# Patient Record
Sex: Female | Born: 1935 | Race: White | Hispanic: No
Health system: Southern US, Community
[De-identification: ages and names within clinical notes are randomized; demographics above are authoritative.]

## PROBLEM LIST (undated history)

## (undated) DIAGNOSIS — Z980 Intestinal bypass and anastomosis status: Secondary | ICD-10-CM

## (undated) DIAGNOSIS — S065X9A Traumatic subdural hemorrhage with loss of consciousness of unspecified duration, initial encounter: Secondary | ICD-10-CM

## (undated) DIAGNOSIS — Z853 Personal history of malignant neoplasm of breast: Secondary | ICD-10-CM

## (undated) DIAGNOSIS — I10 Essential (primary) hypertension: Secondary | ICD-10-CM

## (undated) DIAGNOSIS — K279 Peptic ulcer, site unspecified, unspecified as acute or chronic, without hemorrhage or perforation: Secondary | ICD-10-CM

## (undated) DIAGNOSIS — S065XAA Traumatic subdural hemorrhage with loss of consciousness status unknown, initial encounter: Secondary | ICD-10-CM

## (undated) HISTORY — DX: Personal history of malignant neoplasm of breast: Z85.3

## (undated) HISTORY — DX: Intestinal bypass and anastomosis status: Z98.0

## (undated) HISTORY — DX: Traumatic subdural hemorrhage with loss of consciousness of unspecified duration, initial encounter: S06.5X9A

## (undated) HISTORY — DX: Traumatic subdural hemorrhage with loss of consciousness status unknown, initial encounter: S06.5XAA

## (undated) HISTORY — DX: Peptic ulcer, site unspecified, unspecified as acute or chronic, without hemorrhage or perforation: K27.9

## (undated) HISTORY — DX: Essential (primary) hypertension: I10

---

## 2005-06-15 ENCOUNTER — Ambulatory Visit: Payer: Self-pay | Admitting: Unknown Physician Specialty

## 2005-09-22 ENCOUNTER — Inpatient Hospital Stay: Payer: Self-pay | Admitting: Internal Medicine

## 2007-04-19 ENCOUNTER — Emergency Department: Payer: Self-pay | Admitting: Emergency Medicine

## 2007-04-19 ENCOUNTER — Other Ambulatory Visit: Payer: Self-pay

## 2007-04-21 ENCOUNTER — Emergency Department: Payer: Self-pay | Admitting: Emergency Medicine

## 2007-06-19 ENCOUNTER — Other Ambulatory Visit: Payer: Self-pay

## 2007-06-19 ENCOUNTER — Emergency Department: Payer: Self-pay | Admitting: Emergency Medicine

## 2007-06-22 ENCOUNTER — Other Ambulatory Visit: Payer: Self-pay

## 2007-06-22 ENCOUNTER — Inpatient Hospital Stay: Payer: Self-pay | Admitting: Internal Medicine

## 2007-06-28 ENCOUNTER — Encounter: Payer: Self-pay | Admitting: Internal Medicine

## 2007-06-30 ENCOUNTER — Encounter: Payer: Self-pay | Admitting: Internal Medicine

## 2007-11-17 ENCOUNTER — Other Ambulatory Visit: Payer: Self-pay

## 2007-11-17 ENCOUNTER — Inpatient Hospital Stay: Payer: Self-pay | Admitting: Specialist

## 2008-01-19 ENCOUNTER — Inpatient Hospital Stay: Payer: Self-pay | Admitting: Specialist

## 2008-01-24 ENCOUNTER — Encounter: Payer: Self-pay | Admitting: Internal Medicine

## 2008-01-28 ENCOUNTER — Encounter: Payer: Self-pay | Admitting: Internal Medicine

## 2009-06-05 ENCOUNTER — Emergency Department: Payer: Self-pay | Admitting: Emergency Medicine

## 2009-06-25 ENCOUNTER — Emergency Department: Payer: Self-pay | Admitting: Emergency Medicine

## 2009-06-25 ENCOUNTER — Inpatient Hospital Stay: Payer: Self-pay | Admitting: Internal Medicine

## 2009-06-30 ENCOUNTER — Encounter: Payer: Self-pay | Admitting: Internal Medicine

## 2009-07-03 ENCOUNTER — Ambulatory Visit: Payer: Self-pay | Admitting: Internal Medicine

## 2010-01-05 ENCOUNTER — Inpatient Hospital Stay: Payer: Self-pay | Admitting: Internal Medicine

## 2012-02-05 ENCOUNTER — Inpatient Hospital Stay: Payer: Self-pay | Admitting: Surgery

## 2012-02-05 LAB — URINALYSIS, COMPLETE
Glucose,UR: NEGATIVE mg/dL (ref 0–75)
Ketone: NEGATIVE
Nitrite: NEGATIVE
Ph: 6 (ref 4.5–8.0)
RBC,UR: 1 /HPF (ref 0–5)
Specific Gravity: 1.016 (ref 1.003–1.030)
Squamous Epithelial: <1

## 2012-02-05 LAB — LIPASE, BLOOD: Lipase: 377 U/L (ref 73–393)

## 2012-02-05 LAB — COMPREHENSIVE METABOLIC PANEL
Anion Gap: 13 (ref 7–16)
BUN: 23 mg/dL — ABNORMAL HIGH (ref 7–18)
Bilirubin,Total: 0.6 mg/dL (ref 0.2–1.0)
Co2: 22 mmol/L (ref 21–32)
EGFR (Non-African Amer.): 57 — ABNORMAL LOW
Glucose: 184 mg/dL — ABNORMAL HIGH (ref 65–99)
Potassium: 4.1 mmol/L (ref 3.5–5.1)
SGPT (ALT): 74 U/L
Sodium: 139 mmol/L (ref 136–145)

## 2012-02-05 LAB — CBC
HGB: 10.5 g/dL — ABNORMAL LOW (ref 12.0–16.0)
MCH: 24.2 pg — ABNORMAL LOW (ref 26.0–34.0)
MCV: 79 fL — ABNORMAL LOW (ref 80–100)
Platelet: 307 10*3/uL (ref 150–440)
RBC: 4.33 10*6/uL (ref 3.80–5.20)
RDW: 18 % — ABNORMAL HIGH (ref 11.5–14.5)

## 2012-02-05 LAB — TROPONIN I: Troponin-I: <0.02 ng/mL

## 2012-02-06 LAB — CBC WITH DIFFERENTIAL/PLATELET
Basophil #: 0 10*3/uL (ref 0.0–0.1)
Eosinophil %: 0.6 %
HCT: 29.4 % — ABNORMAL LOW (ref 35.0–47.0)
HGB: 9.1 g/dL — ABNORMAL LOW (ref 12.0–16.0)
Lymphocyte #: 0.6 10*3/uL — ABNORMAL LOW (ref 1.0–3.6)
MCH: 24.4 pg — ABNORMAL LOW (ref 26.0–34.0)
MCHC: 31 g/dL — ABNORMAL LOW (ref 32.0–36.0)
Monocyte #: 0.9 x10 3/mm (ref 0.2–0.9)
Monocyte %: 13.8 %
Neutrophil %: 76.2 %
Platelet: 219 10*3/uL (ref 150–440)
RBC: 3.73 10*6/uL — ABNORMAL LOW (ref 3.80–5.20)
RDW: 17.8 % — ABNORMAL HIGH (ref 11.5–14.5)
WBC: 6.8 10*3/uL (ref 3.6–11.0)

## 2012-02-06 LAB — HEPATIC FUNCTION PANEL A (ARMC)
Albumin: 2.8 g/dL — ABNORMAL LOW (ref 3.4–5.0)
Alkaline Phosphatase: 70 U/L (ref 50–136)
Bilirubin, Direct: 0.1 mg/dL (ref 0.00–0.20)
Bilirubin,Total: 0.5 mg/dL (ref 0.2–1.0)
SGOT(AST): 36 U/L (ref 15–37)
SGPT (ALT): 33 U/L

## 2012-02-06 LAB — BASIC METABOLIC PANEL
Anion Gap: 9 (ref 7–16)
Calcium, Total: 8.2 mg/dL — ABNORMAL LOW (ref 8.5–10.1)
Chloride: 108 mmol/L — ABNORMAL HIGH (ref 98–107)
EGFR (Non-African Amer.): >60
Glucose: 101 mg/dL — ABNORMAL HIGH (ref 65–99)

## 2012-02-06 LAB — LIPASE, BLOOD: Lipase: 118 U/L (ref 73–393)

## 2012-04-29 ENCOUNTER — Inpatient Hospital Stay: Payer: Self-pay | Admitting: Internal Medicine

## 2012-04-29 LAB — BASIC METABOLIC PANEL
Anion Gap: 7 (ref 7–16)
BUN: 23 mg/dL — ABNORMAL HIGH (ref 7–18)
Calcium, Total: 9.6 mg/dL (ref 8.5–10.1)
Co2: 26 mmol/L (ref 21–32)
Creatinine: 1.15 mg/dL (ref 0.60–1.30)
EGFR (African American): 47 — ABNORMAL LOW
EGFR (Non-African Amer.): 40 — ABNORMAL LOW
Glucose: 126 mg/dL — ABNORMAL HIGH (ref 65–99)
Osmolality: 286 (ref 275–301)

## 2012-04-29 LAB — CBC
HCT: 42.2 % (ref 35.0–47.0)
HGB: 13.8 g/dL (ref 12.0–16.0)
MCV: 91 fL (ref 80–100)
RBC: 4.63 10*6/uL (ref 3.80–5.20)
WBC: 7.9 10*3/uL (ref 3.6–11.0)

## 2012-04-29 LAB — HEPATIC FUNCTION PANEL A (ARMC)
Alkaline Phosphatase: 57 U/L (ref 50–136)
Bilirubin, Direct: <0.05 mg/dL (ref 0.00–0.20)
SGOT(AST): 87 U/L — ABNORMAL HIGH (ref 15–37)
Total Protein: 7.2 g/dL (ref 6.4–8.2)

## 2012-04-29 LAB — CK TOTAL AND CKMB (NOT AT ARMC): CK-MB: 3 ng/mL (ref 0.5–3.6)

## 2012-04-29 LAB — URINALYSIS, COMPLETE
Bacteria: NONE SEEN
Bilirubin,UR: NEGATIVE
Blood: NEGATIVE
Glucose,UR: NEGATIVE mg/dL (ref 0–75)
Ketone: NEGATIVE
Leukocyte Esterase: NEGATIVE
Ph: 6 (ref 4.5–8.0)
Protein: NEGATIVE
Specific Gravity: 1.016 (ref 1.003–1.030)
Squamous Epithelial: NONE SEEN

## 2012-04-29 LAB — LIPASE, BLOOD
Lipase: >3000 U/L (ref 73–393)
Lipase: >3000 U/L (ref 73–393)

## 2012-04-30 LAB — CBC WITH DIFFERENTIAL/PLATELET
Basophil %: 0.4 %
Eosinophil #: 0.1 10*3/uL (ref 0.0–0.7)
Eosinophil %: 0.9 %
HCT: 37.5 % (ref 35.0–47.0)
HGB: 12.4 g/dL (ref 12.0–16.0)
Lymphocyte %: 7.5 %
MCH: 30.6 pg (ref 26.0–34.0)
MCHC: 33.2 g/dL (ref 32.0–36.0)
Monocyte #: 0.5 x10 3/mm (ref 0.2–0.9)
Monocyte %: 6.3 %
Neutrophil #: 6.2 10*3/uL (ref 1.4–6.5)
Neutrophil %: 84.9 %
RBC: 4.07 10*6/uL (ref 3.80–5.20)
WBC: 7.3 10*3/uL (ref 3.6–11.0)

## 2012-04-30 LAB — PROTIME-INR
INR: 1
Prothrombin Time: 13.6 secs (ref 11.5–14.7)

## 2012-04-30 LAB — COMPREHENSIVE METABOLIC PANEL
Albumin: 2.8 g/dL — ABNORMAL LOW (ref 3.4–5.0)
BUN: 18 mg/dL (ref 7–18)
Bilirubin,Total: 0.5 mg/dL (ref 0.2–1.0)
Co2: 25 mmol/L (ref 21–32)
Creatinine: 0.94 mg/dL (ref 0.60–1.30)
EGFR (African American): 60 — ABNORMAL LOW
Glucose: 81 mg/dL (ref 65–99)
Potassium: 4.3 mmol/L (ref 3.5–5.1)
SGPT (ALT): 55 U/L (ref 12–78)
Sodium: 141 mmol/L (ref 136–145)
Total Protein: 5.9 g/dL — ABNORMAL LOW (ref 6.4–8.2)

## 2012-04-30 LAB — LIPASE, BLOOD: Lipase: 2947 U/L — ABNORMAL HIGH (ref 73–393)

## 2012-05-01 LAB — CBC WITH DIFFERENTIAL/PLATELET
Eosinophil %: 2 %
HGB: 11 g/dL — ABNORMAL LOW (ref 12.0–16.0)
Lymphocyte #: 0.5 10*3/uL — ABNORMAL LOW (ref 1.0–3.6)
Lymphocyte %: 11.6 %
MCV: 92 fL (ref 80–100)
Monocyte #: 0.4 x10 3/mm (ref 0.2–0.9)
Monocyte %: 8.1 %
Neutrophil #: 3.4 10*3/uL (ref 1.4–6.5)
Neutrophil %: 77.4 %
Platelet: 167 10*3/uL (ref 150–440)
RBC: 3.7 10*6/uL — ABNORMAL LOW (ref 3.80–5.20)
WBC: 4.4 10*3/uL (ref 3.6–11.0)

## 2012-05-01 LAB — BASIC METABOLIC PANEL
BUN: 20 mg/dL — ABNORMAL HIGH (ref 7–18)
Chloride: 112 mmol/L — ABNORMAL HIGH (ref 98–107)
Creatinine: 0.76 mg/dL (ref 0.60–1.30)
EGFR (Non-African Amer.): >60
Glucose: 56 mg/dL — ABNORMAL LOW (ref 65–99)
Potassium: 3.8 mmol/L (ref 3.5–5.1)
Sodium: 141 mmol/L (ref 136–145)

## 2012-05-02 LAB — CBC WITH DIFFERENTIAL/PLATELET
Basophil #: 0 10*3/uL (ref 0.0–0.1)
Basophil %: 0.9 %
Eosinophil #: 0.2 10*3/uL (ref 0.0–0.7)
MCH: 28.3 pg (ref 26.0–34.0)
MCHC: 31.1 g/dL — ABNORMAL LOW (ref 32.0–36.0)
Monocyte #: 0.4 x10 3/mm (ref 0.2–0.9)
Neutrophil #: 3.5 10*3/uL (ref 1.4–6.5)
Neutrophil %: 76 %
Platelet: 172 10*3/uL (ref 150–440)
RDW: 23.4 % — ABNORMAL HIGH (ref 11.5–14.5)

## 2012-05-02 LAB — BASIC METABOLIC PANEL
Anion Gap: 7 (ref 7–16)
BUN: 12 mg/dL (ref 7–18)
Chloride: 113 mmol/L — ABNORMAL HIGH (ref 98–107)
Creatinine: 0.76 mg/dL (ref 0.60–1.30)
EGFR (African American): >60
EGFR (Non-African Amer.): >60
Glucose: 75 mg/dL (ref 65–99)
Osmolality: 282 (ref 275–301)
Sodium: 142 mmol/L (ref 136–145)

## 2012-05-03 LAB — LIPASE, BLOOD: Lipase: 526 U/L — ABNORMAL HIGH (ref 73–393)

## 2012-05-12 ENCOUNTER — Emergency Department: Payer: Self-pay | Admitting: Emergency Medicine

## 2012-05-13 ENCOUNTER — Inpatient Hospital Stay: Payer: Self-pay | Admitting: Internal Medicine

## 2012-05-13 ENCOUNTER — Ambulatory Visit: Payer: Self-pay | Admitting: Orthopaedic Surgery

## 2012-05-13 LAB — APTT: Activated PTT: 29.9 secs (ref 23.6–35.9)

## 2012-05-13 LAB — CBC
HCT: 39.4 % (ref 35.0–47.0)
MCH: 29.8 pg (ref 26.0–34.0)
MCHC: 32.8 g/dL (ref 32.0–36.0)
MCV: 91 fL (ref 80–100)
Platelet: 308 10*3/uL (ref 150–440)
RBC: 4.33 10*6/uL (ref 3.80–5.20)

## 2012-05-13 LAB — TROPONIN I: Troponin-I: <0.02 ng/mL

## 2012-05-13 LAB — COMPREHENSIVE METABOLIC PANEL
Chloride: 107 mmol/L (ref 98–107)
Co2: 26 mmol/L (ref 21–32)
Creatinine: 0.76 mg/dL (ref 0.60–1.30)
EGFR (African American): >60
Glucose: 107 mg/dL — ABNORMAL HIGH (ref 65–99)
Osmolality: 284 (ref 275–301)
SGOT(AST): 17 U/L (ref 15–37)
SGPT (ALT): 12 U/L (ref 12–78)

## 2012-05-13 LAB — PROTIME-INR: INR: 0.9

## 2012-05-13 LAB — CK TOTAL AND CKMB (NOT AT ARMC): CK-MB: 1.5 ng/mL (ref 0.5–3.6)

## 2012-06-25 DIAGNOSIS — F068 Other specified mental disorders due to known physiological condition: Secondary | ICD-10-CM

## 2012-06-25 DIAGNOSIS — D510 Vitamin B12 deficiency anemia due to intrinsic factor deficiency: Secondary | ICD-10-CM

## 2012-06-25 DIAGNOSIS — M810 Age-related osteoporosis without current pathological fracture: Secondary | ICD-10-CM

## 2012-07-20 DIAGNOSIS — S065X9A Traumatic subdural hemorrhage with loss of consciousness of unspecified duration, initial encounter: Secondary | ICD-10-CM

## 2012-07-20 DIAGNOSIS — Z8673 Personal history of transient ischemic attack (TIA), and cerebral infarction without residual deficits: Secondary | ICD-10-CM

## 2012-07-20 DIAGNOSIS — K279 Peptic ulcer, site unspecified, unspecified as acute or chronic, without hemorrhage or perforation: Secondary | ICD-10-CM

## 2012-07-20 DIAGNOSIS — I10 Essential (primary) hypertension: Secondary | ICD-10-CM

## 2012-08-16 DIAGNOSIS — D510 Vitamin B12 deficiency anemia due to intrinsic factor deficiency: Secondary | ICD-10-CM

## 2012-08-16 DIAGNOSIS — K219 Gastro-esophageal reflux disease without esophagitis: Secondary | ICD-10-CM

## 2012-08-16 DIAGNOSIS — M810 Age-related osteoporosis without current pathological fracture: Secondary | ICD-10-CM

## 2012-08-16 DIAGNOSIS — F068 Other specified mental disorders due to known physiological condition: Secondary | ICD-10-CM

## 2012-09-26 DIAGNOSIS — F329 Major depressive disorder, single episode, unspecified: Secondary | ICD-10-CM

## 2012-10-19 DIAGNOSIS — M1990 Unspecified osteoarthritis, unspecified site: Secondary | ICD-10-CM

## 2012-10-19 DIAGNOSIS — I10 Essential (primary) hypertension: Secondary | ICD-10-CM

## 2012-10-19 DIAGNOSIS — M810 Age-related osteoporosis without current pathological fracture: Secondary | ICD-10-CM

## 2012-10-19 DIAGNOSIS — D510 Vitamin B12 deficiency anemia due to intrinsic factor deficiency: Secondary | ICD-10-CM

## 2012-10-19 DIAGNOSIS — E785 Hyperlipidemia, unspecified: Secondary | ICD-10-CM

## 2012-11-28 ENCOUNTER — Ambulatory Visit: Payer: Self-pay | Admitting: Internal Medicine

## 2012-12-11 DIAGNOSIS — F068 Other specified mental disorders due to known physiological condition: Secondary | ICD-10-CM

## 2012-12-11 DIAGNOSIS — F411 Generalized anxiety disorder: Secondary | ICD-10-CM

## 2012-12-11 DIAGNOSIS — M810 Age-related osteoporosis without current pathological fracture: Secondary | ICD-10-CM

## 2012-12-11 DIAGNOSIS — M159 Polyosteoarthritis, unspecified: Secondary | ICD-10-CM

## 2012-12-26 DIAGNOSIS — M1990 Unspecified osteoarthritis, unspecified site: Secondary | ICD-10-CM

## 2013-01-07 ENCOUNTER — Telehealth: Payer: Self-pay | Admitting: Family Medicine

## 2013-01-07 DIAGNOSIS — J209 Acute bronchitis, unspecified: Secondary | ICD-10-CM

## 2013-01-07 NOTE — Telephone Encounter (Signed)
[facility] Triage Call Report Triage Record Num: [id number] Operator: Di Kindle Patient Name: Nina Jones Call Date & Time: 01/06/2013 10:20:22AM Patient Phone: [phone] PCP: Tillman Abide Patient Gender: Female PCP Fax : [phone] Patient DOB: Jan 14, 1915 [facility] Name[facility] Reason for Call: Sharman Cheek Claria Dice [facility] reports onset ~ 1 week of cough, was told Allergies when seen, still congested, on Robitussin, taking 3 times a day, cough is worse at night; afebrile. Daughter is concerned with cough, thought NEB might help. VS: 88, 97.8, 24, 96% RA. Guideline: Cough. MD rounds 01/07/13. Denies nasal discharge. Disposition: See within 24 hours, due to cough worsening when lying down. Verbalizes understanding of care advice, call back parameters. Protocol(s) Used: Cough - Adult Recommended Outcome per Protocol: See Provider within 24 hours Reason for Outcome: Gradual onset of cough when lying down AND relieved after being in a sitting or standing position Care Advice: ~ Avoid any activity that produces symptoms until evaluated by provider. ~ Sleep with head elevated on several pillows or in a recliner. Go to ED IMMEDIATELY if developing increased shortness of breath, continuous cough, worsening fatigue, or unable to perform ADLs. ~ Call EMS 911 if having chest pain lasting more than 5 minutes, shortness of breath that makes walking and talking very difficult, very fast or irregular heartbeat, breathing hard and fast, or lips or nails a blue/grey color. ~ ~ Call provider if develop sudden weight gain, swelling of feet and/or pain in the abdomen, fatigue or trouble sleeping. 01/06/2013 10:35:16AM Page 1 of 1 [email or web address]

## 2013-01-07 NOTE — Telephone Encounter (Signed)
Seen today Started on empiric antibiotics

## 2013-02-21 DIAGNOSIS — R05 Cough: Secondary | ICD-10-CM

## 2013-02-21 DIAGNOSIS — F22 Delusional disorders: Secondary | ICD-10-CM

## 2013-02-21 DIAGNOSIS — F411 Generalized anxiety disorder: Secondary | ICD-10-CM

## 2013-02-21 DIAGNOSIS — F0390 Unspecified dementia without behavioral disturbance: Secondary | ICD-10-CM

## 2013-02-21 DIAGNOSIS — M159 Polyosteoarthritis, unspecified: Secondary | ICD-10-CM

## 2013-03-04 DIAGNOSIS — S93409A Sprain of unspecified ligament of unspecified ankle, initial encounter: Secondary | ICD-10-CM

## 2013-04-23 DIAGNOSIS — M810 Age-related osteoporosis without current pathological fracture: Secondary | ICD-10-CM

## 2013-04-23 DIAGNOSIS — F22 Delusional disorders: Secondary | ICD-10-CM

## 2013-04-23 DIAGNOSIS — F411 Generalized anxiety disorder: Secondary | ICD-10-CM

## 2013-04-23 DIAGNOSIS — F0390 Unspecified dementia without behavioral disturbance: Secondary | ICD-10-CM

## 2013-04-23 DIAGNOSIS — M159 Polyosteoarthritis, unspecified: Secondary | ICD-10-CM

## 2013-05-27 ENCOUNTER — Telehealth: Payer: Self-pay | Admitting: Family Medicine

## 2013-05-27 NOTE — Telephone Encounter (Signed)
[facility] Triage Call Report Triage Record Num: [id number] Operator: Roselyn Meier Patient Name: Nina Jones Call Date & Time: 05/26/2013 4:24:52PM Patient Phone: [phone] PCP: Tillman Abide Patient Gender: Female PCP Fax : [phone] Patient DOB: June 28, 1915 [facility] Name[facility] Reason for Call: Caller: Laura/LPN; PCP: Tillman Abide (Family Practice); CB#: [id number]; Call regarding Congested cough, worsening, wheezing; Caller reports pt is having a congested cough x 4 days (onset 05/22/13). Pt has been treated with Robitussin but it is not helping. Caller reports she listened to patient and she can hear wheezing. Daughter came to visit patient and is requesting pt to have a CXR. Caller reports cough is not productive. Pt denies any chest pain or SOB. Caller reports her cough is getting progressively worse. Vital signs are: 128/62 BP, 90P, 20RR, 93%RA and 99.0 Temp. Triaged patient per Cough-Adult Protocol. See Provider within 24 Hours Disposition for 'Recurrent episodes of uncontrolled coughing interfering with ability to carry out usual activities or with normal sleep patterns'. Per standing orders, RN gave an order to have a CXR done for pt and to also give pt Albuterol 2.5mg  neb x 1. Dr Alphonsus Sias is scheduled to round at facility in the am and caller will make him aware of patient. Care advice given Protocol(s) Used: Cough - Adult Recommended Outcome per Protocol: See Provider within 24 hours Reason for Outcome: Recurrent episodes of uncontrolled coughing interfering with ability to carry out usual activities or with normal sleep patterns Care Advice: Call EMS 911 if sudden onset or sudden worsening of breathing problems, struggling to breathe, high pitched noise when breathing in (stridor), unable to speak, grasping at throat, or panic/anxiety because of breathing problems. ~ 05/26/2013 4:43:02PM Page 1 of 1 [email or web address]

## 2013-05-27 NOTE — Telephone Encounter (Signed)
Reviewed this morning  CXR normal Nebs helped Afebrile now Will continue prn nebs for now

## 2013-05-29 DIAGNOSIS — J209 Acute bronchitis, unspecified: Secondary | ICD-10-CM

## 2013-06-26 DIAGNOSIS — M810 Age-related osteoporosis without current pathological fracture: Secondary | ICD-10-CM

## 2013-06-26 DIAGNOSIS — F0281 Dementia in other diseases classified elsewhere with behavioral disturbance: Secondary | ICD-10-CM

## 2013-06-26 DIAGNOSIS — M1990 Unspecified osteoarthritis, unspecified site: Secondary | ICD-10-CM

## 2013-06-26 DIAGNOSIS — F411 Generalized anxiety disorder: Secondary | ICD-10-CM

## 2013-08-02 ENCOUNTER — Encounter: Payer: Self-pay | Admitting: Podiatry

## 2013-08-02 ENCOUNTER — Ambulatory Visit (INDEPENDENT_AMBULATORY_CARE_PROVIDER_SITE_OTHER): Payer: Medicare Other | Admitting: Podiatry

## 2013-08-02 VITALS — BP 103/59 | HR 63 | Resp 12

## 2013-08-02 DIAGNOSIS — B351 Tinea unguium: Secondary | ICD-10-CM

## 2013-08-02 NOTE — Progress Notes (Signed)
   Subjective:    Patient ID: Nina Jones, female    DOB: 1915-08-06, 80 y.o.   MRN: [id number]  HPI Comments: "My toenails are bad"  Pts caregiver states her toenails need to be trimmed. Toenails long and thick.     Review of Systems  HENT: Positive for hearing loss.   Neurological: Positive for weakness.  All other systems reviewed and are negative.       Objective:   Physical Exam        Assessment & Plan:

## 2013-08-03 NOTE — Progress Notes (Signed)
Subjective:     Patient ID: Nina Jones, female   DOB: Sep 19, 1914, 79 y.o.   MRN: [id number]  HPI patient presents for routine care with caregiver that she cannot cut her nails but they are not tender   Review of Systems  All other systems reviewed and are negative.       Objective:   Physical Exam  Nursing note and vitals reviewed. Constitutional: She is oriented to person, place, and time.  Cardiovascular: Intact distal pulses.   Musculoskeletal: Normal range of motion.  Neurological: She is oriented to person, place, and time.  Skin: Skin is dry.   neurovascular status diminished with nail disease 1-5 both feet with thickness of the nailbeds but no pain when palpated    Assessment:     Mycotic nail infection asymptomatic both feet    Plan:     H&P performed and debridement of all nailbeds today accomplished with no iatrogenic bleeding noted reappoint in 3 months

## 2013-08-20 DIAGNOSIS — M810 Age-related osteoporosis without current pathological fracture: Secondary | ICD-10-CM

## 2013-08-20 DIAGNOSIS — F068 Other specified mental disorders due to known physiological condition: Secondary | ICD-10-CM

## 2013-08-20 DIAGNOSIS — M159 Polyosteoarthritis, unspecified: Secondary | ICD-10-CM

## 2013-08-20 DIAGNOSIS — F411 Generalized anxiety disorder: Secondary | ICD-10-CM

## 2013-10-16 DIAGNOSIS — M810 Age-related osteoporosis without current pathological fracture: Secondary | ICD-10-CM

## 2013-10-16 DIAGNOSIS — IMO0002 Reserved for concepts with insufficient information to code with codable children: Secondary | ICD-10-CM

## 2013-10-16 DIAGNOSIS — F0390 Unspecified dementia without behavioral disturbance: Secondary | ICD-10-CM

## 2013-10-16 DIAGNOSIS — F411 Generalized anxiety disorder: Secondary | ICD-10-CM

## 2013-10-16 DIAGNOSIS — M1710 Unilateral primary osteoarthritis, unspecified knee: Secondary | ICD-10-CM

## 2013-11-01 ENCOUNTER — Ambulatory Visit: Payer: Medicare Other | Admitting: Podiatry

## 2013-12-05 DIAGNOSIS — M810 Age-related osteoporosis without current pathological fracture: Secondary | ICD-10-CM

## 2013-12-05 DIAGNOSIS — F068 Other specified mental disorders due to known physiological condition: Secondary | ICD-10-CM

## 2013-12-05 DIAGNOSIS — S2520XA Unspecified injury of superior vena cava, initial encounter: Secondary | ICD-10-CM

## 2013-12-05 DIAGNOSIS — M159 Polyosteoarthritis, unspecified: Secondary | ICD-10-CM

## 2013-12-05 DIAGNOSIS — F411 Generalized anxiety disorder: Secondary | ICD-10-CM

## 2014-01-24 DIAGNOSIS — S065XAA Traumatic subdural hemorrhage with loss of consciousness status unknown, initial encounter: Secondary | ICD-10-CM

## 2014-01-24 DIAGNOSIS — F329 Major depressive disorder, single episode, unspecified: Secondary | ICD-10-CM

## 2014-01-24 DIAGNOSIS — S065X9A Traumatic subdural hemorrhage with loss of consciousness of unspecified duration, initial encounter: Secondary | ICD-10-CM

## 2014-01-24 DIAGNOSIS — F3289 Other specified depressive episodes: Secondary | ICD-10-CM

## 2014-02-12 DIAGNOSIS — F0150 Vascular dementia without behavioral disturbance: Secondary | ICD-10-CM

## 2014-02-12 DIAGNOSIS — M1710 Unilateral primary osteoarthritis, unspecified knee: Secondary | ICD-10-CM

## 2014-02-12 DIAGNOSIS — M810 Age-related osteoporosis without current pathological fracture: Secondary | ICD-10-CM

## 2014-02-12 DIAGNOSIS — F411 Generalized anxiety disorder: Secondary | ICD-10-CM

## 2014-02-12 DIAGNOSIS — IMO0002 Reserved for concepts with insufficient information to code with codable children: Secondary | ICD-10-CM

## 2014-03-29 ENCOUNTER — Ambulatory Visit: Payer: Self-pay | Admitting: Internal Medicine

## 2014-04-21 DIAGNOSIS — IMO0002 Reserved for concepts with insufficient information to code with codable children: Secondary | ICD-10-CM

## 2014-04-21 DIAGNOSIS — M810 Age-related osteoporosis without current pathological fracture: Secondary | ICD-10-CM

## 2014-04-21 DIAGNOSIS — M159 Polyosteoarthritis, unspecified: Secondary | ICD-10-CM

## 2014-04-21 DIAGNOSIS — F411 Generalized anxiety disorder: Secondary | ICD-10-CM

## 2014-04-21 DIAGNOSIS — F068 Other specified mental disorders due to known physiological condition: Secondary | ICD-10-CM

## 2014-04-28 ENCOUNTER — Inpatient Hospital Stay: Payer: Self-pay | Admitting: Internal Medicine

## 2014-04-28 LAB — COMPREHENSIVE METABOLIC PANEL
ALK PHOS: 320 U/L — AB
ALT: 882 U/L — AB
ALT: 826 U/L — AB
ANION GAP: 15 (ref 7–16)
ANION GAP: 10 (ref 7–16)
AST: 1860 U/L — AB (ref 15–37)
Albumin: 3.5 g/dL (ref 3.4–5.0)
Albumin: 2.8 g/dL — ABNORMAL LOW (ref 3.4–5.0)
Alkaline Phosphatase: 292 U/L — ABNORMAL HIGH
BILIRUBIN TOTAL: 2.9 mg/dL — AB (ref 0.2–1.0)
BUN: 27 mg/dL — ABNORMAL HIGH (ref 7–18)
BUN: 30 mg/dL — ABNORMAL HIGH (ref 7–18)
Bilirubin,Total: 2.8 mg/dL — ABNORMAL HIGH (ref 0.2–1.0)
CALCIUM: 9.8 mg/dL (ref 8.5–10.1)
Calcium, Total: 9 mg/dL (ref 8.5–10.1)
Chloride: 110 mmol/L — ABNORMAL HIGH (ref 98–107)
Chloride: 111 mmol/L — ABNORMAL HIGH (ref 98–107)
Co2: 16 mmol/L — ABNORMAL LOW (ref 21–32)
Co2: 21 mmol/L (ref 21–32)
Creatinine: 1.14 mg/dL (ref 0.60–1.30)
Creatinine: 1.34 mg/dL — ABNORMAL HIGH (ref 0.60–1.30)
EGFR (Non-African Amer.): 40 — ABNORMAL LOW
GFR CALC AF AMER: 47 — AB
GFR CALC AF AMER: 38 — AB
GFR CALC NON AF AMER: 33 — AB
Glucose: 204 mg/dL — ABNORMAL HIGH (ref 65–99)
Glucose: 187 mg/dL — ABNORMAL HIGH (ref 65–99)
Osmolality: 292 (ref 275–301)
Osmolality: 294 (ref 275–301)
POTASSIUM: 4.2 mmol/L (ref 3.5–5.1)
Potassium: 3.8 mmol/L (ref 3.5–5.1)
SGOT(AST): 1347 U/L — ABNORMAL HIGH (ref 15–37)
SODIUM: 141 mmol/L (ref 136–145)
Sodium: 142 mmol/L (ref 136–145)
TOTAL PROTEIN: 6 g/dL — AB (ref 6.4–8.2)
Total Protein: 7.3 g/dL (ref 6.4–8.2)

## 2014-04-28 LAB — URINALYSIS, COMPLETE
BILIRUBIN, UR: NEGATIVE
GLUCOSE, UR: NEGATIVE mg/dL (ref 0–75)
KETONE: NEGATIVE
Nitrite: NEGATIVE
Ph: 6 (ref 4.5–8.0)
Protein: 150
Specific Gravity: 1.01 (ref 1.003–1.030)

## 2014-04-28 LAB — CBC
HCT: 43.8 % (ref 35.0–47.0)
HGB: 14.1 g/dL (ref 12.0–16.0)
MCH: 30.8 pg (ref 26.0–34.0)
MCHC: 32.1 g/dL (ref 32.0–36.0)
MCV: 96 fL (ref 80–100)
Platelet: 240 10*3/uL (ref 150–440)
RBC: 4.57 10*6/uL (ref 3.80–5.20)
RDW: 13.2 % (ref 11.5–14.5)
WBC: 12.6 10*3/uL — ABNORMAL HIGH (ref 3.6–11.0)

## 2014-04-28 LAB — LIPASE, BLOOD
Lipase: >10000 U/L — ABNORMAL HIGH (ref 73–393)
Lipase: 7321 U/L — ABNORMAL HIGH (ref 73–393)

## 2014-04-28 LAB — TROPONIN I: Troponin-I: <0.02 ng/mL

## 2014-04-29 ENCOUNTER — Ambulatory Visit: Payer: Self-pay | Admitting: Internal Medicine

## 2014-04-29 LAB — COMPREHENSIVE METABOLIC PANEL WITH GFR
Albumin: 2.6 g/dL — ABNORMAL LOW
Alkaline Phosphatase: 220 U/L — ABNORMAL HIGH
Anion Gap: 7
BUN: 29 mg/dL — ABNORMAL HIGH
Bilirubin,Total: 1.2 mg/dL — ABNORMAL HIGH
Calcium, Total: 8.3 mg/dL — ABNORMAL LOW
Chloride: 115 mmol/L — ABNORMAL HIGH
Co2: 23 mmol/L
Creatinine: 1.08 mg/dL
EGFR (African American): 50 — ABNORMAL LOW
EGFR (Non-African Amer.): 43 — ABNORMAL LOW
Glucose: 108 mg/dL — ABNORMAL HIGH
Osmolality: 295
Potassium: 4.4 mmol/L
SGOT(AST): 523 U/L — ABNORMAL HIGH
SGPT (ALT): 518 U/L — ABNORMAL HIGH
Sodium: 145 mmol/L
Total Protein: 5.7 g/dL — ABNORMAL LOW

## 2014-04-29 LAB — LIPASE, BLOOD: Lipase: 307 U/L

## 2014-04-30 LAB — CULTURE, BLOOD (SINGLE)

## 2014-04-30 LAB — HEPATIC FUNCTION PANEL A (ARMC)
ALT: 272 U/L — AB
AST: 140 U/L — AB (ref 15–37)
Albumin: 2.4 g/dL — ABNORMAL LOW (ref 3.4–5.0)
Alkaline Phosphatase: 180 U/L — ABNORMAL HIGH
BILIRUBIN DIRECT: 0.3 mg/dL — AB (ref 0.00–0.20)
Bilirubin,Total: 0.9 mg/dL (ref 0.2–1.0)
TOTAL PROTEIN: 5.7 g/dL — AB (ref 6.4–8.2)

## 2014-04-30 LAB — URINE CULTURE

## 2014-05-01 DIAGNOSIS — K8689 Other specified diseases of pancreas: Secondary | ICD-10-CM

## 2014-05-01 DIAGNOSIS — A4151 Sepsis due to Escherichia coli [E. coli]: Secondary | ICD-10-CM

## 2014-05-03 LAB — CULTURE, BLOOD (SINGLE)

## 2014-05-29 ENCOUNTER — Ambulatory Visit: Payer: Self-pay | Admitting: Internal Medicine

## 2014-06-11 DIAGNOSIS — M1990 Unspecified osteoarthritis, unspecified site: Secondary | ICD-10-CM

## 2014-06-11 DIAGNOSIS — F419 Anxiety disorder, unspecified: Secondary | ICD-10-CM

## 2014-06-11 DIAGNOSIS — F0390 Unspecified dementia without behavioral disturbance: Secondary | ICD-10-CM

## 2014-06-11 DIAGNOSIS — M810 Age-related osteoporosis without current pathological fracture: Secondary | ICD-10-CM

## 2014-08-25 DIAGNOSIS — F419 Anxiety disorder, unspecified: Secondary | ICD-10-CM

## 2014-08-25 DIAGNOSIS — F0390 Unspecified dementia without behavioral disturbance: Secondary | ICD-10-CM

## 2014-08-25 DIAGNOSIS — Z87820 Personal history of traumatic brain injury: Secondary | ICD-10-CM

## 2014-08-25 DIAGNOSIS — M150 Primary generalized (osteo)arthritis: Secondary | ICD-10-CM

## 2014-08-27 DIAGNOSIS — B351 Tinea unguium: Secondary | ICD-10-CM

## 2014-09-11 DIAGNOSIS — B351 Tinea unguium: Secondary | ICD-10-CM

## 2014-10-03 ENCOUNTER — Other Ambulatory Visit: Payer: Medicare Other

## 2014-10-15 DIAGNOSIS — G309 Alzheimer's disease, unspecified: Secondary | ICD-10-CM

## 2014-10-15 DIAGNOSIS — M1990 Unspecified osteoarthritis, unspecified site: Secondary | ICD-10-CM

## 2014-10-15 DIAGNOSIS — M810 Age-related osteoporosis without current pathological fracture: Secondary | ICD-10-CM

## 2014-10-31 DIAGNOSIS — B020 Zoster encephalitis: Secondary | ICD-10-CM | POA: Diagnosis not present

## 2014-11-28 ENCOUNTER — Ambulatory Visit
Admit: 2014-11-28 | Disposition: A | Discharge status: Home / Self Care | Payer: Self-pay | Attending: Internal Medicine | Admitting: Internal Medicine

## 2014-11-28 DIAGNOSIS — R1013 Epigastric pain: Secondary | ICD-10-CM

## 2014-11-28 LAB — COMPREHENSIVE METABOLIC PANEL
ALT: 51 U/L
Albumin: 3.3 g/dL — ABNORMAL LOW
Alkaline Phosphatase: 160 U/L — ABNORMAL HIGH
Anion Gap: 10 (ref 7–16)
BILIRUBIN TOTAL: 0.6 mg/dL
BUN: 38 mg/dL — ABNORMAL HIGH
CALCIUM: 9.3 mg/dL
CHLORIDE: 106 mmol/L
CO2: 22 mmol/L
CREATININE: 1.09 mg/dL — AB
EGFR (African American): 49 — ABNORMAL LOW
GFR CALC NON AF AMER: 43 — AB
Glucose: 127 mg/dL — ABNORMAL HIGH
Potassium: 4.3 mmol/L
SGOT(AST): 63 U/L — ABNORMAL HIGH
Sodium: 138 mmol/L
Total Protein: 6.5 g/dL

## 2014-11-28 LAB — CBC WITH DIFFERENTIAL/PLATELET
Basophil #: 0 10*3/uL (ref 0.0–0.1)
Basophil %: 0.3 %
EOS ABS: 0 10*3/uL (ref 0.0–0.7)
Eosinophil %: 0.1 %
HCT: 36 % (ref 35.0–47.0)
HGB: 11.9 g/dL — ABNORMAL LOW (ref 12.0–16.0)
LYMPHS ABS: 0.4 10*3/uL — AB (ref 1.0–3.6)
Lymphocyte %: 2.6 %
MCH: 31.1 pg (ref 26.0–34.0)
MCHC: 33.1 g/dL (ref 32.0–36.0)
MCV: 94 fL (ref 80–100)
Monocyte #: 0.3 x10 3/mm (ref 0.2–0.9)
Monocyte %: 1.9 %
NEUTROS ABS: 15.1 10*3/uL — AB (ref 1.4–6.5)
NEUTROS PCT: 95.1 %
Platelet: 248 10*3/uL (ref 150–440)
RBC: 3.84 10*6/uL (ref 3.80–5.20)
RDW: 14.1 % (ref 11.5–14.5)
WBC: 15.9 10*3/uL — ABNORMAL HIGH (ref 3.6–11.0)

## 2014-11-28 LAB — LACTIC ACID, PLASMA: Lactic Acid, Venous: 2.9 mmol/L

## 2014-11-28 LAB — LIPASE, BLOOD: Lipase: 46 U/L

## 2014-11-29 ENCOUNTER — Inpatient Hospital Stay
Admit: 2014-11-29 | Disposition: A | Discharge status: Hospice | Payer: Self-pay | Attending: Internal Medicine | Admitting: Internal Medicine

## 2014-11-29 LAB — URINALYSIS, COMPLETE
Bilirubin,UR: NEGATIVE
Glucose,UR: NEGATIVE mg/dL (ref 0–75)
KETONE: NEGATIVE
Nitrite: NEGATIVE
PH: 5 (ref 4.5–8.0)
Specific Gravity: 1.044 (ref 1.003–1.030)
Squamous Epithelial: 11
WBC UR: 156 /HPF (ref 0–5)

## 2014-11-30 LAB — CBC WITH DIFFERENTIAL/PLATELET
Basophil #: 0 10*3/uL (ref 0.0–0.1)
Basophil %: 0.2 %
EOS ABS: 0 10*3/uL (ref 0.0–0.7)
Eosinophil %: 0.1 %
HCT: 29.6 % — AB (ref 35.0–47.0)
HGB: 9.7 g/dL — ABNORMAL LOW (ref 12.0–16.0)
LYMPHS ABS: 0.2 10*3/uL — AB (ref 1.0–3.6)
Lymphocyte %: 1.9 %
MCH: 31 pg (ref 26.0–34.0)
MCHC: 32.7 g/dL (ref 32.0–36.0)
MCV: 95 fL (ref 80–100)
Monocyte #: 0.3 x10 3/mm (ref 0.2–0.9)
Monocyte %: 2.6 %
NEUTROS ABS: 10.4 10*3/uL — AB (ref 1.4–6.5)
Neutrophil %: 95.2 %
Platelet: 197 10*3/uL (ref 150–440)
RBC: 3.12 10*6/uL — ABNORMAL LOW (ref 3.80–5.20)
RDW: 14.1 % (ref 11.5–14.5)
WBC: 10.9 10*3/uL (ref 3.6–11.0)

## 2014-11-30 LAB — URINE CULTURE

## 2014-11-30 LAB — CREATININE, SERUM
Creatinine: 0.79 mg/dL
EGFR (African American): >60
EGFR (Non-African Amer.): >60

## 2014-12-01 DIAGNOSIS — G301 Alzheimer's disease with late onset: Secondary | ICD-10-CM

## 2014-12-01 LAB — FOLATE: Folic Acid: 8.7 ng/mL

## 2014-12-02 DIAGNOSIS — C787 Secondary malignant neoplasm of liver and intrahepatic bile duct: Secondary | ICD-10-CM | POA: Diagnosis not present

## 2014-12-02 LAB — CBC WITH DIFFERENTIAL/PLATELET
Basophil #: 0 10*3/uL (ref 0.0–0.1)
Basophil %: 0.1 %
Eosinophil #: 0.1 10*3/uL (ref 0.0–0.7)
Eosinophil %: 0.3 %
HCT: 29.9 % — ABNORMAL LOW (ref 35.0–47.0)
HGB: 9.7 g/dL — AB (ref 12.0–16.0)
LYMPHS PCT: 2.3 %
Lymphocyte #: 0.4 10*3/uL — ABNORMAL LOW (ref 1.0–3.6)
MCH: 30.4 pg (ref 26.0–34.0)
MCHC: 32.5 g/dL (ref 32.0–36.0)
MCV: 93 fL (ref 80–100)
MONOS PCT: 4.6 %
Monocyte #: 0.7 x10 3/mm (ref 0.2–0.9)
NEUTROS PCT: 92.7 %
Neutrophil #: 14.3 10*3/uL — ABNORMAL HIGH (ref 1.4–6.5)
PLATELETS: 251 10*3/uL (ref 150–440)
RBC: 3.2 10*6/uL — ABNORMAL LOW (ref 3.80–5.20)
RDW: 13.9 % (ref 11.5–14.5)
WBC: 15.4 10*3/uL — ABNORMAL HIGH (ref 3.6–11.0)

## 2014-12-02 LAB — COMPREHENSIVE METABOLIC PANEL
ALT: 26 U/L
Albumin: 2 g/dL — ABNORMAL LOW
Alkaline Phosphatase: 92 U/L
Anion Gap: 2 — ABNORMAL LOW (ref 7–16)
BUN: 14 mg/dL
Bilirubin,Total: 0.5 mg/dL
CALCIUM: 7.9 mg/dL — AB
CREATININE: 0.73 mg/dL
Chloride: 111 mmol/L
Co2: 22 mmol/L
Glucose: 107 mg/dL — ABNORMAL HIGH
POTASSIUM: 2.9 mmol/L — AB
SGOT(AST): 37 U/L
SODIUM: 135 mmol/L
Total Protein: 4.9 g/dL — ABNORMAL LOW

## 2014-12-02 LAB — CEA: CEA: 30.5 ng/mL — ABNORMAL HIGH (ref 0.0–4.7)

## 2014-12-02 LAB — CANCER ANTIGEN 19-9: CA 19 9: 786 U/mL — AB (ref 0–35)

## 2014-12-02 LAB — MAGNESIUM: Magnesium: 1.8 mg/dL

## 2014-12-02 LAB — CANCER ANTIGEN 27.29: CA 27.29: 41.8 U/mL — ABNORMAL HIGH (ref 0.0–38.6)

## 2014-12-03 LAB — CULTURE, BLOOD (SINGLE)

## 2014-12-04 LAB — CULTURE, BLOOD (SINGLE)

## 2014-12-16 DIAGNOSIS — C801 Malignant (primary) neoplasm, unspecified: Secondary | ICD-10-CM

## 2014-12-16 DIAGNOSIS — R918 Other nonspecific abnormal finding of lung field: Secondary | ICD-10-CM | POA: Diagnosis not present

## 2014-12-16 DIAGNOSIS — A419 Sepsis, unspecified organism: Secondary | ICD-10-CM | POA: Diagnosis not present

## 2014-12-16 DIAGNOSIS — R160 Hepatomegaly, not elsewhere classified: Secondary | ICD-10-CM | POA: Diagnosis not present

## 2014-12-16 NOTE — Consult Note (Signed)
CC pancreatitis and cholecystitis.  Pt cleaned her supper plate.  No palpable tenderness.  the mild residual elevation in lipase may not indicate any residual serious inflammation.  Given her lack of symptoms and her good appetite and non tender abdomen and her age I think she can likely be discharged.  If concern still exists I suggest a repeat CT scan.   Electronic Signatures: Manya Silvas (MD)  (Signed on 05-Sep-13 18:22)  Authored  Last Updated: 05-Sep-13 18:22 by Manya Silvas (MD)

## 2014-12-16 NOTE — H&P (Signed)
PATIENT NAME:  Nina Jones, Nina Jones MR#:  [id number] DATE OF BIRTH:  [birth year]  DATE OF ADMISSION:  05/13/2012  PRIMARY CARE PHYSICIAN: Dr. Kem Kays  History obtained from patient, her family at bedside. Old records have been reviewed. Imaging studies and EKG personally reviewed. Case discussed with the ER physician, Dr. Michel Santee. Case was also previously discussed with neurosurgery.    CHIEF COMPLAINT: Fall with headache and right forearm pain.   HISTORY OF PRESENTING ILLNESS: 81 year old Caucasian female patient with history of prior cerebrovascular accident on Aggrenox, vitamin B12 deficiency and recent acute cholecystitis who at baseline walks with a walker and resides in an assisted living facility presented to the Emergency Room after having a fall. Patient was trying to pull a chair close to her which caused her fall. Hit her head, did not lose consciousness. CT of the head showed a 4 mm subdural hematoma in the right frontoparietal area. Case was discussed with neurosurgery by Dr. Michel Santee of Emergency Room who suggested no acute intervention at this time secondary to her advanced age and comorbidities. This was discussed with the family who agreed that patient just needs medical management at this time, did not want any aggressive measures and patient is being admitted to the hospitalist service for further monitoring and management.   Patient presently complains of severe pain in her right forearm where she does have radius fracture, status post cast. She also has mild headache but is tolerable. Did not have any nausea, vomiting, loss of consciousness, or change in vision, speech, or any focal weakness.   Patient was on Coumadin in the past, was changed to Aggrenox for CVA secondary to her falls.   PAST MEDICAL HISTORY:  1. Transient ischemic attack/cerebrovascular accident in 2011 on Aggrenox.  2. Bilateral carotid artery disease.  3. Recent acute cholecystitis with no surgery advised.   4. Breast cancer, status post bilateral mastectomies.  5. Peptic ulcer disease, status post Billroth II surgery.  6. Pancreatitis.  7. Osteoporosis.  8. Vitamin B12 deficiency.  9. Pseudogout.  10. Degenerative joint disease.  11. Hyperlipidemia.  12. Hypertension.   PAST SURGICAL HISTORY:  1. Appendectomy.  2. Hysterectomy, oophorectomy. 3. Partial gastrectomy with Billroth II for peptic ulcer disease. 4. Bilateral hip repair surgeries.  5. Right elbow fracture repair.  6. Knee fracture repair.   ALLERGIES: Ambien, codeine, morphine, NSAIDs which caused gastric upset.   HOME MEDICATIONS:  1. Vitamin B12 1000 mcg oral daily.  2. Tylenol 650 mg every four hours as needed.  3. Simvastatin 20 mg daily.  4. Protonix 40 mg daily.  5. Calcium and vitamin D 1 tablet twice daily.  6. Ferrous sulfate 325 mg twice daily.  7. Aggrenox 25/200 mg twice a day.   SOCIAL HISTORY: Patient lives at [facility] facility. Does not smoke. No alcohol. No illicit drugs. At baseline walks with a walker. Is conversational. Is oriented to person and place but not to time.   CODE STATUS: DO NOT RESUSCITATE/DO NOT INTUBATE.   FAMILY HISTORY: Significant for cancer in her dad.   REVIEW OF SYSTEMS: CONSTITUTIONAL: Complains of no fatigue, weakness, weight loss, weight gain. EYES: No blurred vision. Does have trouble with vision at baseline. No pain or redness. ENT: No tinnitus, ear pain. Has hearing loss. RESPIRATORY: No cough, wheeze, hemoptysis. CARDIOVASCULAR: No chest pain, orthopnea. Does have lower extremity edema which is chronic. Had an ultrasound of the lower extremities which showed no deep vein thrombosis. GASTROINTESTINAL: No nausea, vomiting, diarrhea,  abdominal pain. GENITOURINARY: No dysuria, hematuria. Has occasional incontinence. ENDOCRINE: No polyuria, nocturia, thyroid problems. HEMATOLOGIC/LYMPHATIC: No anemia, easy bleeding. Does have easy bruising with multiple bruises on  her body. INTEGUMENTARY: Has small external hematoma over her right side of forehead, multiple bruises. MUSCULOSKELETAL: Has chronic arthritic pain. No swelling. Presently she has fracture of the right side radius. NEUROLOGIC: Has peripheral neuropathy. No dysarthria or focal weakness. PSYCHIATRIC: No anxiety, depression.   PHYSICAL EXAMINATION:  VITAL SIGNS: Temperature 98.1, pulse 81, blood pressure 194/91 which has improved to 155/66, saturating 95% on room air.   GENERAL: Frail, elderly Caucasian female patient sitting up in bed, comfortable, conversational, cooperative with exam.   PSYCHIATRIC: Alert and awake, oriented to person but not to place or time.   HEENT: Trauma over the right forehead with small hematoma and small open wound. Pupils bilaterally equal and reactive to light. Oral mucosa moist and pink. No pallor. No icterus.   NECK: Supple. No thyromegaly. No palpable lymph nodes. Trachea midline. No carotid bruit, JVD.   CARDIOVASCULAR: S1, S2, regular rate and rhythm without any murmurs.   RESPIRATORY: Normal work of breathing. Clear to auscultation on both sides.   GASTROINTESTINAL: Soft abdomen, nontender. Bowel sounds present. No hepatosplenomegaly palpable.   SKIN: Warm and dry. Has hematoma of the right forehead.   MUSCULOSKELETAL: Pain in the right forearm with cast. No other joint pain, swelling.   NEUROLOGICAL: Motor strength 5/5 in upper and lower extremities. Sensation to fine touch intact all over.   LABORATORY, DIAGNOSTIC AND RADIOLOGICAL DATA: Lab studies show glucose 107, BUN 8, creatinine 0.76, sodium 143, potassium 3.7, chloride 107. AST, ALT, alkaline phosphatase normal. Troponin less than 0.02. WBC 7.9, hemoglobin 12.9, platelets 308, INR 0.9 with PTT 29.9.   CT of the head shows small acute subdural hemorrhage along right frontoparietal convexity with max size of 4 mm and chronic small vessel ischemic changes.   CT cervical spine shows no cervical spine  fractures.   EKG shows normal sinus rhythm with no acute ST-T wave changes.   ASSESSMENT AND PLAN:  1. Acute subdural hematoma of the right frontoparietal area. This is secondary to the fall and patient being on Aggrenox. She does not have any neurological deficits or change in her mental status at this time but needs to be closely monitored. On discussion with neurosurgery no intervention has been advised and also family agrees for no aggressive treatment. Will hold off on any blood thinners, heparin. Patient will be monitored in the hospital with frequent neurochecks. May need a repeat CT scan if she worsens.  2. Hypertension. Patient has had well controlled blood pressure in the past and her blood pressure medications were stopped. Presently is elevated likely secondary to the bleed. Will need monitoring. No antihypertensive medications at this time.   3. History of cerebrovascular accident. No Aggrenox at this time. Will continue the statin patient is on.   4. Right radius fracture - Consult orthopedics. Will need reduction or surgery. 5. Deep vein thrombosis prophylaxis with TEDs.  6. CODE STATUS: DNR/DNI.   TIME SPENT: Time spent on this case was 45 minutes with more than 50% time spent in coordination of care.    ____________________________ Leia Alf. Onesty Clair, MD srs:cms D: 05/13/2012 17:08:00 ET T: 05/14/2012 05:21:30 ET JOB#: [id number]  cc: Alveta Heimlich R. Darvin Neighbours, MD, <Dictator> Lorenza Evangelist, MD Neita Carp MD ELECTRONICALLY SIGNED 05/14/2012 12:02
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
## 2014-12-16 NOTE — Consult Note (Signed)
Chief Complaint:   Subjective/Chief Complaint abdominal pain improved, minimal nausea, would like to eat something.   VITAL SIGNS/ANCILLARY NOTES: **Vital Signs.:   02-Sep-13 14:00   Vital Signs Type Routine   Temperature Temperature (F) 98.1   Celsius 36.7   Temperature Source Oral   Pulse Pulse 64   Respirations Respirations 20   Systolic BP Systolic BP 518   Diastolic BP (mmHg) Diastolic BP (mmHg) 68   Mean BP 88   Pulse Ox % Pulse Ox % 94   Pulse Ox Activity Level  At rest   Oxygen Delivery 2L   Brief Assessment:   Cardiac Regular    Respiratory clear BS    Gastrointestinal details normal Soft  Nondistended  No masses palpable  Bowel sounds normal  mild tenderness generalized, much improved, no distension   Lab Results: Hepatic:  02-Sep-13 05:50    Bilirubin, Total 0.5   Alkaline Phosphatase 53   SGPT (ALT) 55   SGOT (AST)  50   Total Protein, Serum  5.9   Albumin, Serum  2.8  Routine Chem:  01-Sep-13 17:50    Lipase  > 3000 (Result(s) reported on 29 Apr 2012 at 06:47PM.)  02-Sep-13 05:50    Lipase  2947 (Result(s) reported on 30 Apr 2012 at 06:30AM.)   Glucose, Serum 81   BUN 18   Creatinine (comp) 0.94   Sodium, Serum 141   Potassium, Serum 4.3   Chloride, Serum  111   CO2, Serum 25   Calcium (Total), Serum 8.7   Osmolality (calc) 282   eGFR (African American)  60   eGFR (Non-African American)  52 (eGFR values <29m/min/1.73 m2 may be an indication of chronic kidney disease (CKD). Calculated eGFR is useful in patients with stable renal function. The eGFR calculation will not be reliable in acutely ill patients when serum creatinine is changing rapidly. It is not useful in  patients on dialysis. The eGFR calculation may not be applicable to patients at the low and high extremes of body sizes, pregnant women, and vegetarians.)   Anion Gap  5  Routine Coag:  02-Sep-13 05:50    Prothrombin 13.6   INR 1.0 (INR reference interval applies to patients on  anticoagulant therapy. A single INR therapeutic range for coumarins is not optimal for all indications; however, the suggested range for most indications is 2.0 - 3.0. Exceptions to the INR Reference Range may include: Prosthetic heart valves, acute myocardial infarction, prevention of myocardial infarction, and combinations of aspirin and anticoagulant. The need for a higher or lower target INR must be assessed individually. Reference: The Pharmacology and Management of the Vitamin K  antagonists: the seventh ACCP Conference on Antithrombotic and Thrombolytic Therapy. CACZYS.0630Sept:126 (3suppl): [id number] A HCT value >55% may artifactually increase the PT.  In one study,  the increase was an average of 25%. Reference:  "Effect on Routine and Special Coagulation Testing Values of Citrate Anticoagulant Adjustment in Patients with High HCT Values." American Journal of Clinical Pathology 2006;126:400-405.)  Routine Hem:  02-Sep-13 05:50    WBC (CBC) 7.3   RBC (CBC) 4.07   Hemoglobin (CBC) 12.4   Hematocrit (CBC) 37.5   Platelet Count (CBC) 194   MCV 92   MCH 30.6   MCHC 33.2   RDW  24.6   Neutrophil % 84.9   Lymphocyte % 7.5   Monocyte % 6.3   Eosinophil % 0.9   Basophil % 0.4   Neutrophil # 6.2   Lymphocyte #  0.6   Monocyte # 0.5   Eosinophil # 0.1   Basophil # 0.0 (Result(s) reported on 30 Apr 2012 at [facility].)   Assessment/Plan:  Assessment/Plan:   Assessment 1) biliary pancreatitis-improving.  patient with h/o BII surgery for PUDz in the 1960's.  patient sx have been recurrent/intermittant.    Plan 1) continue observation, daily labs.  if recurrent sx check MRCP.  High risk for surgery due to age and comorbidities.  Dr Vira Agar to see tomorrow.   Electronic Signatures: Loistine Simas (MD)  (Signed 02-Sep-13 15:57)  Authored: Chief Complaint, VITAL SIGNS/ANCILLARY NOTES, Brief Assessment, Lab Results, Assessment/Plan   Last Updated: 02-Sep-13 15:57 by Loistine Simas (MD)

## 2014-12-16 NOTE — H&P (Signed)
PATIENT NAME:  DULSE, RUTAN MR#:  [id number] DATE OF BIRTH:  31-Jul-1915  DATE OF ADMISSION:  04/29/2012  PRIMARY CARE PHYSICIAN: Kem Kays, MD  REFERRING PHYSICIAN: Lurline Hare, MD  CHIEF COMPLAINT: Abdominal pain, nausea and vomiting.   HISTORY OF PRESENT ILLNESS: This is a 80 year old female who has significant past medical history of recent episode cholecystitis which was managed conservatively and medically. The patient was admitted in June of this year with cholecystitis where she was treated with IV antibiotics as she was deemed to be a nonsurgical candidate. The patient presents today with complaints of abdominal pain, nausea, and vomiting where she vomited x2 in the nursing home. The patient has been complaining of abdominal pain for the last 24 hours. Blood work-up showed elevated lipase level to more than 3000 and then ultrasound of the abdomen was obtained which did show distended gallbladder with sludge and wall thickening and pericholecystic fluid concerning for cholecystitis as well as biliary duct enlargement and pancreatic duct prominence reflecting a component of biliary duct obstruction as well. The patient was afebrile and did not have significant leukocytosis. As well the patient is an extremely poor historian and hard of hearing so could not give much of a reliable history and HPI. The patient was started on IV Zosyn in the ED where blood cultures were sent and was started on IV fluids and kept n.p.o. The patient denies any diarrhea or constipation and reports she has decreased oral intake and has decreased appetite. As well the patient has past medical history of peptic ulcer disease where she underwent partial gastrectomy, Billroth II, many years ago.   PAST MEDICAL HISTORY:  1. History of breast cancer status post bilateral mastectomies.  2. History of peptic ulcer disease requiring partial gastrectomy, Billroth II.  3. History of pancreatitis in the past.  4. History of  cerebrovascular accident with mild hemiparesis.  5. Osteoporosis.  6. Vitamin B12 deficiency.  7. Pseudogout.  8. Degenerative joint disease.  9. Hyperlipidemia.  10. Hypertension.  11. History of transient ischemic attack in 2011.  12. Bilateral carotid artery disease. Last Echo in 2011 showing ejection fraction of 50%, right atrium moderately dilated, and moderate mitral regurgitation and moderate tricuspid regurgitation.  13. Recent diagnosis of acute cholecystitis in June 2013 which was managed conservatively with IV antibiotics. No surgical intervention required.   PAST SURGICAL HISTORY:  1. Appendectomy.  2. Hysterectomy.  3. Oophorectomy.  4. Partial gastrectomy for peptic ulcer disease.  5. Bilateral hip fracture repair.  6. Right elbow fracture repair.  7. Knee fracture repair.   ALLERGIES: Ambien, codeine, morphine, and  NSAIDs.   HOME MEDICATIONS: 1. Vitamin B12 1000 mcg oral daily. 2. Tylenol 650 mg every four hours as needed.  3. Simvastatin 20 mg daily. 4. Protonix 40 mg daily. 5. Calcium and vitamin D one tablet twice a day. 6. Ferrous sulfate 325 mg twice a day. 7. Aggrenox 25/200 mg twice a day.  SOCIAL HISTORY: The patient is a resident of [facility]. No history of smoking or alcohol use.   FAMILY HISTORY: Significant for cancer as her Dad died from cancer.   REVIEW OF SYSTEMS: The patient is a very poor historian and hard of hearing where it is very hard to obtain, but generally she denies any fever. Complains of weakness and decreased oral intake. Denies any visual problems, headache, cough, shortness of breath, or chest pain. Reports she has been having some mild abdominal pain. She had some  nausea and vomiting. She denies any other complaints. She has previous history of cerebrovascular accident.   PHYSICAL EXAMINATION:  VITAL SIGNS: Temperature 96.8, pulse 90, respiratory rate 18, blood pressure 151/73, and saturating 98% on room air.    GENERAL: Elderly female, cachetic, lies comfortable in bed, in no apparent distress.   HEENT: Pupils are equal and reactive to light. Pink conjunctivae. Anicteric sclerae. Dry oral mucosa.   NECK: Supple. No thyromegaly. No JVD.   LUNGS: Bilateral air entry good. No wheezing, rales, or rhonchi. No use of accessory muscle.   HEART: S1 and S2 heard. No rubs, murmurs, or gallops. No lower extremity edema. Good peripheral pulses. Regular rate and rhythm.   ABDOMEN: Has some diffuse abdominal tenderness more pronounced in the right upper quadrant area. Normal bowel sounds. No hepatosplenomegaly. No masses. No guarding. No rigidity.   NEURO: The patient is awake and alert to name only. She is not aware of where she is or what is the date. Unable to cooperate with full physical examination.   EXTREMITIES: No cyanosis. No clubbing.   SKIN: Poor skin turgor. Warm and dry.   PSYCHIATRIC: The patient is awake, confused, and oriented to name only.   PERTINENT LABS/RADIOLOGIC STUDIES: Glucose 126, BUN 23, creatinine 1.15, sodium 141, potassium 4.5, chloride 108, and CO2 26. Lipase more than 3000. Total bilirubin 0.5, direct bilirubin less than 0.05, alkaline phosphatase 57, AST 87, and ALT 31. Troponin less than 0.02. White blood cells 7.9, hemoglobin 15.8, hematocrit 42.2, and platelets 286.   Urinalysis negative.   Ultrasound of abdomen is showing distended gallbladder, sludge, tiny gallstones, and gravel with mild wall thickening and trace pericholecystic fluid which raises concern for cholecystitis as well as biliary ductal enlargement and pancreatic duct prominence which could reflect component of biliary ductal obstruction.   ASSESSMENT AND PLAN:  1. Acute cholecystitis with choledocholithiasis. The patient has evidence of acute cholecystitis/choledocholithiasis on her ultrasound. Even though her LFTs are not impressive, surgery was consulted. Meanwhile the patient will be kept n.p.o. on IV  fluids and will be started on IV Zosyn and her Aggrenox will be held for possible need of surgical intervention. We will check an INR in the a.m.  2. Gallstone pancreatitis. We will start the patient on p.r.n. Fentanyl for pain management and IV fluid resuscitation. We will consult the GI service as well even though ERCP seems to be hard at this point given the fact of the patient's history of partial gastrectomy, Billroth II. We will continue to follow lipase trend. The patient will be kept n.p.o. and on IV fluids.  3. Hypotension. The patient is dehydrated. We will hold all her hypertensive home medications until she is appropriately hydrated.  4. Hyperlipidemia. We will hold statin until she is more stable.  5. History of CVA with left hemiparesis and previous transient ischemic attack. We will continue to hold Aggrenox as there might be possible of surgery.  6. The patient is intermediate risk for noncardiac surgery as the EKG does not show any acute ischemia and she has negative troponins and denies any chest pain or shortness of breath.  7. GI prophylaxis. IV Protonix. 8. DVT prophylaxis. Subcutaneous heparin.   CODE STATUS: FULL CODE.   TOTAL TIME SPENT ON ADMISSION AND PATIENT CARE: 55 minutes.  ____________________________ Albertine Patricia, MD dse:slb D: 04/29/2012 06:07:21 ET    T: 04/29/2012 13:05:42 ET        JOB#: [id number] cc: Albertine Patricia, MD, <Dictator> Lennette Bihari.  Sabra Heck, MD Binyomin Brann Graciela Husbands MD ELECTRONICALLY SIGNED 04/30/2012 1:08

## 2014-12-16 NOTE — Discharge Summary (Signed)
PATIENT NAME:  Nina Jones, Nina Jones MR#:  [id number] DATE OF BIRTH:  1915/04/06  DATE OF ADMISSION:  04/29/2012 DATE OF DISCHARGE:  05/04/2012  PRIMARY CARE PHYSICIAN: Kem Kays, M.D.   DISCHARGE DIAGNOSES:  1. Acute cholecystitis.  2. Choledocholithiasis.  3. Acute pancreatitis secondary to gallstones.  4. Hypertension.  5. Chronic anemia.  6. Transaminitis secondary to gallstones.  7. Acute renal failure.   CONSULTS:  1. Dr. Burt Knack, surgery. 2. Dr. Vira Agar, GI.   IMAGING STUDIES: Ultrasound of the abdomen showed distended gallbladder with small stones and sludge. Thickened wall. Common bile duct dilation.   ADMITTING HISTORY AND PHYSICAL: Please see detailed history and physical dictated on 04/29/2012. In brief, 81 year old female patient with past medical history of cholecystitis managed conservatively status post Billroth procedure in the past who presents to the Emergency Room complaining of nausea, vomiting, and abdominal pain. Ultrasound of the abdomen showed acute cholecystitis with gallstone and dilated common bile duct and was admitted to the hospitalist service with surgical and GI consults.   HOSPITAL COURSE:  1. Acute cholecystitis with choledocholithiasis. The patient was assessed by Dr. Burt Knack of surgery who did not suggest any surgical intervention secondary to her advanced age and comorbidities. GI consult was obtained after this who suggested watchful following of the patient with her enzymes. The patient did not need any surgeries. She was thought to be high risk for any ERCP secondary to her advanced age and comorbidities. On the day of discharge, the patient is tolerating food and does not have any abdominal pain. She is continued on antibiotics and her acute cholecystitis and choledocholithiasis seem to be improving.  2. Gallstone acute pancreatitis. The patient did have an elevated lipase greater than 3000 secondary to her gallstones. No surgery is planned at this time  secondary to high risk. The patient's lipase has trended down significantly and her abdominal pain is resolved. She is tolerating food.   On the day of discharge the patient's blood pressure is 135/73, pulse 74, temperature 98.1, saturating 96% on room air with a benign abdominal exam. She is being discharged back to her assisted living facility in stable condition.   DISCHARGE MEDICATIONS: 1. Calcium, vitamin D 1 tablet oral 2 times a day.  2. Aggrenox 25/200, 1 capsule oral 2 times a day.  3. Vitamin B12 1000 mcg oral once a day.  4. Ferrous sulfate 325 mg oral 2 times a day.  5. Tylenol 650 mg orally every four hours as needed for pain.  6. Protonix 40 mg oral 2 times a day.  7. Mycogen topical application 2 times a day to the affected area.  8. Ciprofloxacin 250 mg oral once a day for nine days.  9. Flagyl 500 mg oral 3 times a day for nine days.  10. Acetaminophen tramadol 325 mg/37 .5 mg, 1 tablet orally 2 times a day as needed for pain.   DISCHARGE INSTRUCTIONS: Discharge to assisted living facility.   DIET AND ACTIVITY: As prior to hospitalization.   FOLLOWUP: The patient is to follow up with her primary care physician within a week.   TIME SPENT: Time spent today on this discharge dictation along with coordinating care and counseling of the patient was greater than 35 minutes.   ____________________________ Leia Alf Fawna Cranmer, MD srs:bjt D:  05/04/2012 14:48:43 ET          T: 05/07/2012 11:17:50 ET         JOB#: [id number]  cc: Lorenza Evangelist, MD Alveta Heimlich R  Vasil Juhasz MD ELECTRONICALLY SIGNED 05/08/2012 0:04

## 2014-12-16 NOTE — Consult Note (Signed)
PATIENT NAME:  Nina Jones MR#:  [id number] DATE OF BIRTH:  Jan 29, 1915  DATE OF CONSULTATION:  04/29/2012  REFERRING PHYSICIAN:   CONSULTING PHYSICIAN:  Harrell Gave A. Breta Demedeiros, MD  REASON FOR CONSULTATION: Epigastric pain, nausea, vomiting, elevated lipase and sludge on gallbladder ultrasound.  HISTORY OF PRESENT ILLNESS: Nina Jones is a pleasant 81 year old female who was recently admitted for possible cholecystitis in June per Dr. Pat Patrick who presented here with epigastric pain, nausea/vomiting.  She is a poor historian, possibly due to vascular dimentia. She says that her pain is all over, but specifically it appears more epigastric and bilateral. She says that she did vomit after she arrived to the hospital and she says she has not had a bowel movement today but has not had any diarrhea or constipation and says she may be able to have a bowel movement if she gets up and in the proper position. She says she has subjective fevers. Otherwise no chills, no night sweats, shortness of breath, cough, change in her sensorium, or numbness in any fingers.   PAST MEDICAL HISTORY:  1. Peptic ulcer disease requiring partial gastrectomy and Billroth II anastomosis.  2. Breast cancer status post bilateral mastectomies.  3. Pancreatitis.  4. Appendectomy.  5. Hysterectomy.  6. Urinary tract infections.  7. Cerebrovascular accident in the past with mild hemiparesis.  8. Osteoporosis.  9. Vitamin B12 deficiency.  10. Pseudogout.  11. Degenerative joint disease.  12. Hyperlipidemia.  13. Hypertension.  14. History of bilateral carotid artery disease. Last echocardiogram  in 2011 suggested and EF of 50%, moderate MR, and moderate TR  15. Possible cholecystitis.   CURRENT MEDICATIONS:  (per chart) 1. Vitamin B12 1000 mg one daily.  2. Tylenol 650 mg p.o. every 4 hours p.r.n. pain.  3. Zocor 20 mg p.o. at bedtime.  4. Protonix 40 mg p.o. daily.  5. Os-Cal D one tab twice a day. 6. Iron sulfate 325 mg  one tab p.o. twice a day. 7. Aggrenox 25/200 mg one cap p.o. twice a day.  DRUG ALLERGIES: Codeine, Ambien, morphine, and NSAIDs.   SOCIAL HISTORY: Resides at [facility]. She has no family here with her. She denies any tobacco use or alcohol use.   FAMILY HISTORY: Noncontributory.   REVIEW OF SYSTEMS: Unable to obtain a complete review of systems as the patient is a poor historian and hard of hearing.   PHYSICAL EXAMINATION:   VITAL SIGNS: Temperature 96.8, pulse 90, blood pressure 151/73, respirations 18, and pulse oximetry 98%.   GENERAL: No acute distress, alert, interactive, appears oriented x3.   HEAD: Normocephalic, atraumatic.   EYES: Scleral icterus. No conjunctivitis.   FACE: Normal external nose, normal external ears.   CHEST: Bilateral mastectomy scars healed.  LUNGS: Clear to auscultation, moving air well.   HEART: Regular rate and rhythm. No murmurs, rubs, or gallops.   ABDOMEN: Has an upper midline incision as well as a Chevron, likely secondary to her gastrectomy, and has a lower midline scar, probably for her hysterectomy.  Also has a right lower quadrant McBurney incision for an appendectomy. Abdomen is relatively soft, nontender, and nondistended.   EXTREMITIES: Moves all extremities well.   LABORATORY DATA: Labs are significant for a white cell count that is normal at 7.9, hemoglobin and hematocrit 13.8 and 42.2, and platelets are 286.   Urinalysis is negative.  Complete metabolic panel is significant for lipase of greater than 3000. LFTs are otherwise normal.   IMAGING: I personally reviewed  Nina Jones  CT scan from 02/05/2012. Of note it does appear that she has a small gastric remnant likely in agreement with her previous history of Billroth II as well as a short duodenal stump. She does have a distended gallbladder as well as fluid around the gallbladder at that time. She also had a common bile duct which was greater than 1 cm externally  as well as a dilated pancreatic duct.   Ultrasound from today shows pericholecystic fluid, gallbladder sludge, dilated common bile duct greater than 1 cm, and dilated pancreatic duct. This is similar to what was seen on a previous CT.   ASSESSMENT AND PLAN: Ms. Issa is a pleasant 81 year old female who was previously seen by cardiology when evaluated for possible cholecystitis back in June. It appears, although images are difficult to compare, that she has similar radiographic findings as she did at that time. However, she does not have an elevated white blood cell count and it is relatively nontender on palpation so I do not think the patient has cholecystitis requiring bringing her to surgery, however, her lipase is elevated at greater than 3,000. I have spoken with the internal medicine doctors and they do not believe there is a medication etiology behind this and therefore I will discuss possible need for cholecystectomy to prevent recurrent pancreatitis. I would recommend cardiology consult to evaluate the patient as a potential operative candidate as well as hold Aggrenox at this time. It will be greater than 5 to 7 days if       cholecystectomy is chosen. Of note, this could potentially be a difficult surgery due to prior operations requiring conversion to open cholecystectomy which would be very morbid in an 81 year old female.  Thank you for allowing [place] to assist in the care of this very interesting and complicated patient.  ____________________________ Glena Norfolk. Tichina Koebel, MD cal:slb D: 04/29/2012 06:11:52 ET T: 04/29/2012 15:12:53 ET JOB#: [id number]  cc: Harrell Gave A. Beni Turrell, MD, <Dictator> Floyde Parkins MD ELECTRONICALLY SIGNED 04/29/2012 20:41

## 2014-12-16 NOTE — Consult Note (Signed)
PATIENT NAME:  Nina Jones, Nina Jones MR#:  [id number] DATE OF BIRTH:  [birth year]  DATE OF CONSULTATION:  05/13/2012  REFERRING PHYSICIAN:  Dr. Darvin Neighbours  CONSULTING PHYSICIAN:  Maryan Char. Kennedy Bucker, MD  CHIEF COMPLAINT: Right distal radius fracture.   HISTORY OF PRESENT ILLNESS: This is a very pleasant 81 year old female who suffered a fall and a head injury sometime within the last two days. She was admitted today after the fall with right arm pain and a 4 mm subdural hematoma in the right frontal parietal area. I was consulted when she arrived to the floor for a right distal radius fracture found on x-ray. The patient denies open wounds or bleeding at the time of the injury. She denies any past injury to the right wrist.   PAST MEDICAL HISTORY:  1. Dementia. 2. Hypertension. 3. Degenerative joint disease. 4. Hypercholesterolemia. 5. Osteoporosis. 6. B12 deficiency. 7. Gastric ulcer. 8. Breast cancer. 9. CVA. 10. Partial gastrectomy. 11. Appendectomy. 12. Double mastectomy.   CURRENT MEDICATIONS:  1. Vitamin B12. 2. Pantoprazole. 3. Nystatin. 4. Ferrous sulfate. 5. Aggrenox.   ALLERGIES: The patient has allergies to Ambien, codeine, morphine, and NSAIDs.   PHYSICAL EXAMINATION:   VITAL SIGNS: Temperature 98.3, pulse 85, respirations 22, blood pressure 167/77, pulse oximetry 95%.   HEENT: Pupils are equal, round, and reactive to light. Conjunctivae are clear with the exception of the right side. There is moderate injection noted. There is also a large laceration and ecchymosis surrounding the right eye as well as several superficial abrasions.   NECK: Supple. No noted JVD.   MUSCULOSKELETAL: A focused musculoskeletal examination of the right upper extremity shows a deformity with bruising about the right distal radius and hand. There is no tenderness to palpation about the right shoulder or elbow. There is limited range of motion of the right wrist secondary to pain. Her nerve function  is limited. She appears to have sensation in radial, ulnar, and median nerve distributions with limited function secondary to pain. Her capillary refill is less than 2 seconds throughout the right hand. No noted lymphadenopathy and no signs of infection.   X-rays of the right wrist show a dorsally angulated right distal radius fracture with mild comminution.   ASSESSMENT: This is a 81 year old female with a right displaced angulated right distal radius fracture.   PLAN: Will attempt a closed reduction under hematoma block on the floor. Will splint and then follow-up for likely casting.   PROCEDURE NOTE: After verbal consent for closed reduction by both the patient and her daughter over the telephone, we proceeded with an attempt at a closed reduction. After sterilizing the skin, a hematoma block was done at the level of the fracture site on the right side using approximately 8 mL of 1% lidocaine without epinephrine. After adequate time to set up, we attempted a closed reduction of the right wrist with longitudinal traction and volar angulation, however, during the reduction she suffered a skin tear of the dorsal skin just proximal to the dorsal wrist skin crease measuring approximately 5 cm transverse across the wrist. At this point the reduction was aborted and we proceeded to close her wound.   We irrigated out the wound with approximately 1.5 liters of normal saline mixed with Betadine. We then prepped the area with Betadine swabs and then we infiltrated the area with 1% lidocaine in preparation for skin closure. After infiltration of the lidocaine, I did probe the wound sterilely to ensure this wound did not communicate with the  fracture site and was unable to communicate with the fracture. Next, I proceeded with closure.   I used 3-0 nylon in a horizontal mattress stitch fashion to close the wound.   After wound closure I once again swabbed the area with Betadine and then sterilely dressed the  wound using Xeroform, 4 x 4's, Webril, and then placed her wrist in a volar wrist splint.   The patient tolerated the procedure well.   COMPLICATIONS: Skin tear closed primarily after thorough irrigation of the wound.   POSTREDUCTION COURSE: I will go ahead and start her on IV antibiotics. I will give her three doses of Ancef. I suggest she go on oral antibiotics to try to prevent any sort of infection for at least five days. I also suggest close wound monitoring to ensure that she heals. The postreduction films suggest her right distal radius still appears to be dorsally angulated. Suggest likely just casting in this position to try to avoid any further complications with her skin.   I did discuss the skin tear with both the primary care team and with her family and they voiced their understanding. The patient should follow-up with Dr. Christophe Louis for follow-up on her distal radius and either casting versus prolonged splinting and wound monitoring.   ____________________________ Maryan Char. Kennedy Bucker, MD rdc:drc D: 05/13/2012 21:24:48 ET T: 05/14/2012 08:47:01 ET JOB#: [id number]  cc: Maryan Char. Kennedy Bucker, MD, <Dictator> Laurey Arrow MD ELECTRONICALLY SIGNED 06/14/2012 11:51

## 2014-12-16 NOTE — Discharge Summary (Signed)
PATIENT NAME:  Nina Jones, Nina Jones MR#:  [id number] DATE OF BIRTH:  1915/02/18  DATE OF ADMISSION:  05/13/2012 DATE OF DISCHARGE:  05/16/2012   PRIMARY CARE PHYSICIAN: Dr. Kem Kays.   DISCHARGE DIAGNOSES:  1. Acute subdural hematoma of right frontoparietal area.  2. Right radius fracture status post reduction.  3. Hypertension.  4. Recurrent falls.   IMAGING STUDIES:  1. CT scan of the head which showed small right-sided frontoparietal acute subdural hematoma with largest dimension being 4 millimeters.  2. An x-ray of the right hand shows right radius fracture.   CONSULTANT: Dr. Eino Farber, orthopedics.   ADMITTING HISTORY AND PHYSICAL: Please see detailed history and physical dictated on 05/13/2012. In brief, a 81 year old Caucasian female patient with history of hypertension, cerebrovascular accident, on Aggrenox, who presented to the Emergency Room after having a mechanical fall and had subdural hematoma of acute right frontoparietal area of 4 millimeters. Case was discussed with neurosurgery who suggested no intervention and careful monitoring and family decided on no aggressive management and the patient was admitted to the hospitalist service at [facility]   HOSPITAL COURSE:  1. Acute subdural hematoma. The patient did not have any mental status changes. Did not complain of any headache. This was a small subdural hematoma of 4 mm. No aggressive measures were opted by the family and patient was on neurochecks, doing well.  2. Right radius fracture. Orthopedics saw the patient, had a closed reduction complicated by skin tear after which she had a dressing and cast placed and was on Ancef three doses during the hospital stay and is doing well with pain control.   On the day of discharge, the patient is sitting up in bed, awake, at her baseline. Temperature 98.6, blood pressure 133/81, saturating 94% on room air with normal cardiac examination and is being  discharged to skilled nursing facility in stable condition.   DISCHARGE MEDICATIONS:  1. Ferrous sulfate 325 mg oral 2 times a day.  2. Percocet 5/325, one to two tablets every four hours as needed for pain.  3. Protonix 40 mg oral 2 times a day.  4. Vitamin B12 1000 mcg oral once a day.  5. Calcium with vitamin D 500/200 international units, 1 tablet oral two times a day.  6. Nystatin 1000 units/g topical cream to affected area twice a day as needed.   DISCHARGE INSTRUCTIONS:  1. Transfer to skilled nursing facility.  2. Further management as per physician at the skilled nursing facility.  3. The patient will be on activity as tolerated with assistance at all times to prevent any falls. 4. Follow up with orthopedics as outpatient in a week.  5. The patient will be on a regular diet.   TIME SPENT ON THE DAY OF DISCHARGE ON DISCHARGE DICTATION ALONG WITH COORDINATING CARE: 35 minutes.    ____________________________ Leia Alf Shekira Drummer, MD srs:ap D: 05/16/2012 11:22:16 ET T: 05/16/2012 11:35:14 ET JOB#: [id number]  cc: Alveta Heimlich R. Darvin Neighbours, MD, <Dictator> Lorenza Evangelist, MD Neita Carp MD ELECTRONICALLY SIGNED 05/22/2012 12:14

## 2014-12-16 NOTE — Consult Note (Signed)
Brief Consult Note: Diagnosis: right distal radius fx.   Patient was seen by consultant.   Consult note dictated.   Discussed with Attending MD.   Comments: 81 yo female with right distal radius fx;pt fell 2 days ago; seen in ED today and admitted with head bleed no surgical; i was consulted for a right distal radius fx once she arrived on the floor; i spoke with the pt and the pt's daughter about doing a closed reduction and was given verbal telephone consent to proceed;  PE RUE NVI with deformity and pain about the right distal radius  XR- comminuted dorsally angulated distal radius fx  A/P 81 yo female with comminuted displaced right distal radius fx after consent I attempted a closed reduction with a hematoma block during the reduction the pt suffered a dorsal wrist skin tear about 5 cm in length across the dorsal wrist the reduction was aborted I proceeded to irrigated the wound with 1.5 liters of betadine and NS then under sterile conditions I closed the wound after ensuring there was no communication with the fx I then splinted the wrist post reduction films were ordered I suugest 3 doses of IV abx to help prevent infection and then oral abx for total of 5 days suggest close wound monitoring to ensure healing NWB right UE likely casting of the RUE with window to monitor wound healing FU with Dr. Margaretmary Eddy [facility] Spoke with daughter about skin tear and with primary team.  Electronic Signatures: Laurey Arrow (MD)  (Signed 15-Sep-13 21:16)  Authored: Brief Consult Note   Last Updated: 15-Sep-13 21:16 by Laurey Arrow (MD)

## 2014-12-16 NOTE — Consult Note (Signed)
CC: acute pancreatitis and acute cholecystitis.  Lipase coming down and patient with less abd pain.  Gall bladder with distention likely due to cystic duct stone.  As long as TB not elevated I think you can treat with iv antibiotics and avoid surgery.  Due to previous BR II she would either need open surgery (which given her age she would be high mortality risk) or possible med center transfer.  Since she is making progress would continue current therapy.  Electronic Signatures: Manya Silvas (MD)  (Signed on 03-Sep-13 18:11)  Authored  Last Updated: 03-Sep-13 18:11 by Manya Silvas (MD)

## 2014-12-20 NOTE — Discharge Summary (Signed)
Dates of Admission and Diagnosis:  Date of Admission 28-Apr-2014   Date of Discharge 01-Jan-0001   Admitting Diagnosis Pnacreatitis   Final Diagnosis 1. E coli bacteremia 2. E coli Urinary tract infection 3. Gall stone pancreatitis 4. Obstructive jaundice 5. Transaminitis    Chief Complaint/History of Present Illness ADMISSION DIAGNOSES: Gallstone pancreatitis and urinary tract infection.   HISTORY OF PRESENT ILLNESS: This is a 81 year old Caucasian female who presents to the Emergency Department from her nursing home complaining of chest pain. The patient points more to her epigastric region when she demonstrates where her pain originated. Her daughters were here to provide some information to the Emergency Department staff but were unavailable for questioning at the time of hospital admission. The patient is talkative but not always completely clear with her history, as dementia plays some part in her memory. Emergency Department evaluation revealed very high lipase and an intrahepatic blockage consistent with gallstone pancreatitis, which prompted Emergency Department staff to call the hospitalist staff for admission. The following histories are gleaned from previous admissions as the patient cannot contribute much to her own history.   Allergies:  Codeine: GI Distress  Ambien: Unknown  Morphine: Unknown  NSAIDS: Unknown  Hepatic:  31-Aug-15 01:45   Bilirubin, Total  2.8  Alkaline Phosphatase  320 (46-116 NOTE: New Reference Range 03/18/14)  SGPT (ALT)  882 (14-63 NOTE: New Reference Range 03/18/14)  SGOT (AST)  1860  Total Protein, Serum 7.3  Albumin, Serum 3.5    09:46   Bilirubin, Total  2.9  Alkaline Phosphatase  292 (46-116 NOTE: New Reference Range 03/18/14)  SGPT (ALT)  826 (14-63 NOTE: New Reference Range 03/18/14)  SGOT (AST)  1347  Total Protein, Serum  6.0  Albumin, Serum  2.8  Routine Micro:  31-Aug-15 02:00   Organism Name GRAM NEGATIVE ROD  Micro  Text Report URINE CULTURE   ORGANISM 1                >100,000 CFU/ML GRAM NEGATIVE ROD   ORGANISM 2                50,000 CFU/ML GRAM NEGATIVE ROD   COMMENT                   ID TO FOLLOW SENSITIVITIES TO FOLLOW   ANTIBIOTIC                        Organism 1 >100,000 CFU/ML GRAM NEGATIVE ROD  Culture Comment ID TO FOLLOW SENSITIVITIES TO FOLLOW  Result(s) reported on 30 Apr 2014 at 09:10AM.  Organism Quantity 50,000 CFU/ML  Specimen Source IN/OUT CATH  Organism 2 50,000 CFU/ML GRAM NEGATIVE ROD  Routine Chem:  31-Aug-15 01:45   Glucose, Serum  204  BUN  27  Creatinine (comp) 1.14  Sodium, Serum 141  Potassium, Serum 3.8  Chloride, Serum  110  CO2, Serum  16  Calcium (Total), Serum 9.8  Osmolality (calc) 292  eGFR (African American)  47  eGFR (Non-African American)  40 (eGFR values <22m/min/1.73 m2 may be an indication of chronic kidney disease (CKD). Calculated eGFR is useful in patients with stable renal function. The eGFR calculation will not be reliable in acutely ill patients when serum creatinine is changing rapidly. It is not useful in  patients on dialysis. The eGFR calculation may not be applicable to patients at the low and high extremes of body sizes, pregnant women, and vegetarians.)  Anion Gap 15  Lipase  > 10000 (Result(s) reported on 28 Apr 2014 at 03:07AM.)    09:46   Glucose, Serum  187  BUN  30  Creatinine (comp)  1.34  Sodium, Serum 142  Potassium, Serum 4.2  Chloride, Serum  111  CO2, Serum 21  Calcium (Total), Serum 9.0  Osmolality (calc) 294  eGFR (African American)  38  eGFR (Non-African American)  33 (eGFR values <49m/min/1.73 m2 may be an indication of chronic kidney disease (CKD). Calculated eGFR is useful in patients with stable renal function. The eGFR calculation will not be reliable in acutely ill patients when serum creatinine is changing rapidly. It is not useful in  patients on dialysis. The eGFR calculation may not be  applicable to patients at the low and high extremes of body sizes, pregnant women, and vegetarians.)  Anion Gap 10  Lipase  7321 (Result(s) reported on 28 Apr 2014 at 10:24AM.)  Cardiac:  31-Aug-15 01:45   Troponin I < 0.02 (0.00-0.05 0.05 ng/mL or less: NEGATIVE  Repeat testing in 3-6 hrs  if clinically indicated. >0.05 ng/mL: POTENTIAL  MYOCARDIAL INJURY. Repeat  testing in 3-6 hrs if  clinically indicated. NOTE: An increase or decrease  of 30% or more on serial  testing suggests a  clinically important change)  Routine UA:  31-Aug-15 02:00   Color (UA) Amber  Clarity (UA) Cloudy  Glucose (UA) Negative  Bilirubin (UA) Negative  Ketones (UA) Negative  Specific Gravity (UA) 1.010  Blood (UA) 1+  pH (UA) 6.0  Protein (UA) 150 mg/dL  Nitrite (UA) Negative  Leukocyte Esterase (UA) 3+ (Result(s) reported on 28 Apr 2014 at 02:37AM.)  RBC (UA) 5 /HPF  WBC (UA) 40 /HPF  Bacteria (UA) 2+  Epithelial Cells (UA) 2 /HPF  WBC Clump (UA) PRESENT  Mucous (UA) PRESENT (Result(s) reported on 28 Apr 2014 at 02:37AM.)  Routine Hem:  31-Aug-15 01:45   WBC (CBC)  12.6  RBC (CBC) 4.57  Hemoglobin (CBC) 14.1  Hematocrit (CBC) 43.8  Platelet Count (CBC) 240 (Result(s) reported on 28 Apr 2014 at 01:55AM.)  MCV 96  MCH 30.8  MCHC 32.1  RDW 13.2   PERTINENT RADIOLOGY STUDIES: XRay:    31-Aug-15 01:35, Chest Portable Single View  Chest Portable Single View   REASON FOR EXAM:    cp  COMMENTS:       PROCEDURE: DXR - DXR PORTABLE CHEST SINGLE VIEW  - Apr 28 2014  1:35AM     CLINICAL DATA:  Chest pain, upper abdominal pain    EXAM:  PORTABLE CHEST - 1 VIEW    COMPARISON:  04/29/2012    FINDINGS:  Mild hyperinflation and interstitial coarsening. Mild biapical  scarring. Mild left lung base opacity. Aortic tortuosity and  scattered atherosclerosis. Heart size upper normal. No definite  pleural effusion. No pneumothorax. Osteopenia and multilevel  degenerative changes. Sequelae  of prior vertebroplasty.     IMPRESSION:  Mild left lung base opacity; atelectasis/scar (favored) versus  infiltrate.      Electronically Signed    By: ACarlos LeveringM.D.    On: 04/28/2014 01:47         Verified By: ATommi Rumps M.D.,    01-Sep-15 18:47, Chest Portable Single View  Chest Portable Single View   REASON FOR EXAM:    wheezing  COMMENTS:       PROCEDURE: DXR - DXR PORTABLE CHEST SINGLE VIEW  - Apr 29 2014  6:47PM     CLINICAL DATA:  Wheezing    EXAM:  PORTABLE CHEST - 1 VIEW    COMPARISON:  Portable exam 7564 hr compared 04/28/2014    FINDINGS:  Normal heart size, mediastinal contours, and pulmonary vascularity.  Atherosclerotic calcification aorta.    Bronchitic changes with bibasilar atelectasis slightly increased on  LEFT.    Upper lungs clear.    No gross pleural effusion or pneumothorax.    Bones demineralized.     IMPRESSION:  Bibasilar atelectasis greater on LEFT.    Bronchitic changes.  Electronically Signed    By: Lavonia Dana M.D.    On: 04/29/2014 19:04         Verified By: Burnetta Sabin, M.D.,  [place]:    31-Aug-15 04:17, US Abdomen Limited Survey  US Abdomen Limited Survey   REASON FOR EXAM:    N/V/ABD PAIN; H/O GALLSTONE PANCREATITIS  COMMENTS:   Body Site: Gallbladder, Liver, Common Bile Duct    PROCEDURE: [place]  - US ABDOMEN LIMITED SURVEY  - Apr 28 2014  4:17AM     CLINICAL DATA:  Abdominal pain, history of gallstone pancreatitis    EXAM:  US ABDOMEN LIMITED - RIGHT UPPER QUADRANT    COMPARISON:  04/29/2012    FINDINGS:  Gallbladder:  Gallbladder contains stones with tumefactive sludge versus a mass at  the gallbladder fundus measuring 2.6 cm. There is trace  pericholecystic fluid and mild gallbladder wall thickening up to 4  mm elsewhere. No definite sonographic Murphy sign.    Common bile duct:    Diameter: Dilated up to 15 mm. Intrahepatic ducts are also  prominent.    Liver:    1.3 cm echogenic focus  within the right hepatic lobe is nonspecific.  Punctate granuloma noted.   IMPRESSION:  Acute or chronic cholecystitis is not excluded. Tumefactive sludge  versus mass at the gallbladder fundus.    Biliary ductal dilatation persists.    1.3 cm echogenic focus within the right hepatic lobe is  indeterminate.    Due to dementia, patient unable to cooperate with breathing  maneuvers.      Electronically Signed    By: Carlos Levering M.D.    On: 04/28/2014 04:43         Verified By: Tommi Rumps, M.D.,  [place]:    31-Aug-15 01:35, Chest Portable Single View  PACS Image     31-Aug-15 04:17, US Abdomen Limited Survey  PACS Image     01-Sep-15 18:47, Chest Portable Single View  PACS Image    Pertinent Past History:  Pertinent Past History PAST MEDICAL HISTORY:  1.  TIA while on Aggrenox in 2011. 2.  Bilateral carotid artery disease.  3.  Acute cholecystitis.  4.  Breast cancer.  5.  Peptic ulcer disease following gastric bypass.  6.  Pancreatitis.  7.  Osteoporosis.  8.  Pseudogout.  9.  Vitamin B12 deficiency.  10. Degenerative joint disease.  11. Hyperlipidemia.  12. Hypertension.   PAST SURGICAL HISTORY: Billroth type II, bilateral mastectomies, appendectomy, hysterectomy with oophorectomy, bilateral hip repair, right elbow fracture, knee repair, as well as right radius fracture reduction.   Hospital Course:  Hospital Course 81 f with HTN. TIA, carotid stenosis, gall stone pancreatitis here with abd pain  * E coli bacteremia due to UTI Needs 11 more days of PO abx Afebrile now On IV abx in hsopital  * Gall stone pancreatitis Improving. Lipase normal. LFTs improving. Likely passed a stone  * Dementia  Discussed with daughter  Hospice follow up at SNF  Time spent on discharge 40 minutes   Condition on Discharge Guarded   Code Status:  Code Status No Code/Do Not Resuscitate   PHYSICAL EXAM ON DISCHARGE:  Physical Exam:  GEN no acute  distress, thin   HEENT pale conjunctivae, moist oral mucosa   NECK supple   RESP normal resp effort  clear BS   CARD regular rate   ABD positive tenderness  soft  normal BS   EXTR negative edema   PSYCH alert   VITAL SIGNS:  Vital Signs: **Vital Signs.:   02-Sep-15 06:48  Vital Signs Type Routine  Temperature Temperature (F) 98.5  Celsius 36.9  Temperature Source oral  Pulse Pulse 76  Respirations Respirations 18  Systolic BP Systolic BP 889  Diastolic BP (mmHg) Diastolic BP (mmHg) 71  Mean BP 84  Pulse Ox % Pulse Ox % 96  Pulse Ox Activity Level  At rest  Oxygen Delivery 2L   DISCHARGE INSTRUCTIONS HOME MEDS:  Medication Reconciliation: Patient's Home Medications at Discharge:     Medication Instructions  oyster shell calcium with vitamin d 500 mg-200 intl units oral tablet  1 tab(s) orally 2 times a day for calcium supplement.   vitamin b-12 500 mcg oral tablet  1 tab(s) orally once a day   tylenol 325 mg oral tablet  2 tab(s) orally 2 times a day   ensure clear  1   carton three times a day po   loratadine 10 mg oral tablet  1 tab(s) orally once a day, As Needed   albuterol 2.5 mg/3 ml (0.083%) inhalation solution  3 milliliter(s) inhaled every 6 hours as needed for wheezing   oxycodone 5 mg oral tablet  1 tab(s) orally every 4 hours, As Needed - for Pain   celexa  5 milligram(s) orally once a day   phenergan 25 mg rectal suppository  1 suppository(ies) rectal every 4 hours, As Needed - for Nausea, Vomiting   cephalexin 500 mg oral tablet  1 tab(s) orally 2 times a day    PRESCRIPTIONS: PRINTED AND PLACED ON CHART   Physician's Instructions:  Diet Low Fat, Low Cholesterol   Activity Limitations As tolerated   Return to Work Not Applicable   Time frame for Follow Up Appointment 1-2 days  Physician at rehab   Electronic Signatures: Wells Gerdeman, Lottie Dawson (MD)  (Signed 02-Sep-15 09:13)  Authored: ADMISSION DATE AND DIAGNOSIS, CHIEF COMPLAINT/HPI,  Allergies, PERTINENT LABS, PERTINENT RADIOLOGY STUDIES, PERTINENT PAST HISTORY, HOSPITAL COURSE, PHYSICAL EXAM ON DISCHARGE, VITAL SIGNS, DISCHARGE INSTRUCTIONS HOME MEDS, PATIENT INSTRUCTIONS   Last Updated: 02-Sep-15 09:13 by Alba Destine (MD)

## 2014-12-20 NOTE — H&P (Signed)
PATIENT NAME:  Nina, ARMWOOD MR#:  [id number] DATE OF BIRTH:  Oct 13, 1914  DATE OF ADMISSION:  04/28/2014  REFERRING PHYSICIAN: Gretchen Short. Beather Arbour, MD.   PRIMARY CARE PHYSICIAN:  Ocie Cornfield. Ouida Sills, MD.    ADMISSION DIAGNOSES: Gallstone pancreatitis and urinary tract infection.   HISTORY OF PRESENT ILLNESS: This is a 81 year old Caucasian female who presents to the Emergency Department from her nursing home complaining of chest pain. The patient points more to her epigastric region when she demonstrates where her pain originated. Her daughters were here to provide some information to the Emergency Department staff but were unavailable for questioning at the time of hospital admission. The patient is talkative but not always completely clear with her history, as dementia plays some part in her memory. Emergency Department evaluation revealed very high lipase and an intrahepatic blockage consistent with gallstone pancreatitis, which prompted Emergency Department staff to call the hospitalist staff for admission. The following histories are gleaned from previous admissions as the patient cannot contribute much to her own history.   REVIEW OF SYSTEMS  CONSTITUTIONAL: The patient denies fever or fatigue.  EYES: The patient denies blurred vision or inflammation.  ENT: The patient denies tinnitus or sore throat.  RESPIRATORY: The patient denies coughing or wheezing or shortness of breath. CARDIOVASCULAR: The patient admitted to chest pain but denies dyspnea on his on exertion or palpitations.  GASTROINTESTINAL: The patient admits to nausea and vomiting but denies diarrhea. She denies abdominal pain, but notably points to her abdomen when she states her chest hurts.  GENITOURINARY: It is unclear if the patient has any dysuria, but she admits to increased frequency.  ENDOCRINE: The patient admits to excessive thirst but does not endorse nocturia. -HEMATOLOGIC AND LYMPHATIC: The patient denies easy bruising or  bleeding.  MUSCULOSKELETAL: The patient denies myalgias, but admits to aches and pains in her joints.  NEUROLOGIC: The patient denies numbness or trouble speaking.  PSYCHIATRIC: The patient denies depression or suicidal ideation.   PAST MEDICAL HISTORY:  1.  TIA while on Aggrenox in 2011. 2.  Bilateral carotid artery disease.  3.  Acute cholecystitis.  4.  Breast cancer.  5.  Peptic ulcer disease following gastric bypass.  6.  Pancreatitis.  7.  Osteoporosis.  8.  Pseudogout.  9.  Vitamin B12 deficiency.  10. Degenerative joint disease.  11. Hyperlipidemia.  12. Hypertension.   PAST SURGICAL HISTORY: Billroth type II, bilateral mastectomies, appendectomy, hysterectomy with oophorectomy, bilateral hip repair, right elbow fracture, knee repair, as well as right radius fracture reduction.   FAMILY HISTORY: The patient's father died of cancer.   SOCIAL HISTORY: The patient lives a nursing home. She denies history of smoking. She does not use alcohol or illicit drugs.   PERTINENT LABORATORY AND RADIOGRAPHIC FINDINGS: Troponin negative. White blood cell count 12.6, hemoglobin 14.1, hematocrit 43.8, BUN 27, creatinine 1.14, chloride 110, bicarbonate 16, bilirubin is 2.8, alkaline phosphatase is 320, AST 1860 and ALT 882, lipase is more than 10,000.   Urinalysis shows 2+ bacteria, 3+ leukocyte esterase, negative nitrites, negative ketones, but 150 mg/dL of protein.   Ultrasound of the abdomen reveals a 1.3 echogenic focus within the right hepatic lobe. Impression reads as it as tumefactive sludge versus mass at the gallbladder fundus, biliary ductal dilatation persists.   Chest x-ray shows a mild left lung base opacity, atelectasis or scar is favored over infiltrate.   PHYSICAL EXAMINATION:  VITAL SIGNS: Temperature is 98.1, pulse 103, respirations 18, blood pressure 89/59, pulse oximetry  is 95% on 2 liters of oxygen via nasal cannula.  GENERAL: The patient is alert and oriented to self  and place. She is in no apparent distress.  HEENT: Normocephalic, atraumatic, extraocular movements are intact, pupils are equal, round, and reactive to light. Mucous membranes are slightly dry.  NECK: Trachea is midline. No adenopathy.  CHEST: Symmetric. Well-healed bilateral mastectomy scars.  CARDIOVASCULAR: Tachycardic, normal S1, S2. No rubs, clicks or murmurs.  LUNGS: Clear to auscultation bilaterally. Normal effort and excursion.  ABDOMEN: Surprisingly only mild tenderness in the epigastric region. No distention, soft. Positive bowel sounds. No hepatosplenomegaly.  GENITOURINARY: Deferred.  MUSCULOSKELETAL: The patient certainly moves all 4 extremities. She does not comply exactly with strength testing.  SKIN: No rashes or lesions visualized.  EXTREMITIES: No clubbing, cyanosis, or edema.  NEUROLOGIC: Cranial nerves II-XII are grossly intact.  PSYCHIATRIC: Mood is pleasant, affect is congruent.   ASSESSMENT AND PLAN: This is a 81 year old female with acute gallstone pancreatitis.  1.  Pancreatitis. This is recurrent. The patient has been evaluated in the past, and was not deemed a surgical candidate. The patient's family also does not want any invasive interventions including ERCP.  Thus for right now we will leave the patient n.p.o. We will manage her pain and give her IV hydration.  2.  Urinary tract infection. The patient has been given a dose of Zosyn in the Emergency Department. I will continue this every 12 hours and consult pharmacy for dosing.  3.  Will hold all of the patient's oral medications as none of them are critical at this time.  4.  Gastrointestinal prophylaxis. Consider IV Nexium, as it may help with pain.  5.  DVT prophylaxis. SCDs for now. The patient is not currently on any anticoagulation as the risk of pharmacologic prophylaxis outweighs the benefit.   The patient is a DNR.  She does not want resuscitative measures.   Time spent on admission orders and patient  care: Approximately 35 minutes.    ____________________________ Norva Riffle. Marcille Blanco, MD msd:lt D: 04/28/2014 06:57:05 ET T: 04/28/2014 07:17:52 ET JOB#: [id number]  cc: Norva Riffle. Marcille Blanco, MD, <Dictator> Norva Riffle Sayre Mazor MD ELECTRONICALLY SIGNED 04/28/2014 23:58

## 2014-12-21 NOTE — Consult Note (Signed)
PATIENT NAME:  Nina Jones, Nina Jones MR#:  [id number] DATE OF BIRTH:  31-Dec-1914  DATE OF CONSULTATION:  02/05/2012  REFERRING PHYSICIAN:  Dr. Pat Patrick  CONSULTING PHYSICIAN:  Mena Pauls, MD  PRIMARY CARE PHYSICIAN: Kem Kays, MD   REASON FOR CONSULTATION: Risk assessment for surgery.   CHIEF COMPLAINT: History was obtained from the patient's daughters. The patient is a poor historian, very hard of hearing. She is a resident of [place], was sent from [place] because of nausea and vomiting.   HISTORY OF PRESENT ILLNESS: This is a 81 year old female who resides at [place]. She has history of breast cancer with bilateral mastectomies in the past. She had history of left-sided CVA with mild hemiparesis. She was last admitted here in May of 2011 because of TIA. She also has history of peptic ulcer disease requiring partial gastrectomy and degenerative joint disease. As per the daughters, she did not eat any supper yesterday and breakfast this morning. She also has been having some abdominal pain and having some nausea and vomiting. They are not sure when the symptoms started but was a few days ago. EMS was called in at [facility]. When she came to the Emergency Room her blood pressure very high in the range of 205/69 most likely secondary to severe pain. She got 6.25 mg of IV fentanyl push in the Emergency Room and she had a CT of the abdomen and pelvis done which showed that the gallbladder is distended with cholelithiasis and pericholecystic fluid concerning for acute cholecystitis. She is being admitted for surgery for IV fluids and antibiotics and for conservative management at this time. She has no previous history of biliary colic. She has no cardiac history in the past. No history of MI, congestive heart failure. She denies any chest pain or shortness of breath. No palpitations. She walks with a walker at [facility] without any dyspnea  on exertion. The patient actually is denying any pain whatsoever at this time.   PAST MEDICAL HISTORY:  1. History of breast cancer status post bilateral mastectomies. 2. History of peptic ulcer disease requiring partial gastrectomy. 3. History of pancreatitis in the past.  4. History of cerebrovascular accident with mild hemiparesis. 5. Osteoporosis.  6. Vitamin B12 deficiency. 7. Pseudogout. 8. Degenerative joint disease.  9. Hyperlipidemia.  10. Hypertension.  11. She was last admitted in May of 2011 because of TIA.  12. She has bilateral carotid artery disease also. Her last echocardiogram in 2011 was suggestive of EF of 50%, right atrium moderately dilated, moderate MR and moderate TR.   PAST SURGICAL HISTORY:  1. Appendectomy. 2. Hysterectomy. 3. Oophorectomy.  4. Partial gastrectomy for peptic ulcer disease.  5. Bilateral hip fracture repair. 6. Right elbow fracture repair. 7. Knee fracture repair.   ALLERGIES TO MEDICATIONS: Ambien, codeine, morphine, and NSAIDs.   MEDICATIONS FROM [facility]: 1. Aggrenox 25/200 twice a day.  2. Norvasc 2.5 mg daily. 3. Omeprazole 20 mg daily. 4. Calcium D twice a day  5. Tylenol q.4 hours p.r.n.  6. Vitamin B12 1000 mcg daily.  7. Zocor 20 mg daily.   SOCIAL HISTORY: She is a resident of [place]. No smoking or alcohol use.   FAMILY HISTORY: Significant for cancer. Her dad had cancer.   REVIEW OF SYSTEMS: Very hard to obtain because the patient has significant hearing loss. She denies any fever or weakness. No headache at this time. No cough or shortness of breath. No  chest pain. She denies any abdominal pain per se but she was sent here because of nausea and vomiting. No urinary complaints. She has previous history of stroke.   PHYSICAL EXAMINATION:   VITAL SIGNS: Her last vitals include pulse rate of 95, respiratory rate 14, blood pressure 164/57, saturating 94% on room air.   GENERAL: She is  an elderly Caucasian female, wasted appearance, cachectic. She is comfortably lying in bed in no acute distress at this time.   HEENT: Bilateral pupils are equal. Extraocular muscles are intact. No scleral icterus. No conjunctivitis. Oral mucosa is slightly dry.   NECK: No thyroid tenderness, enlargement, or nodule. Neck is supple. No masses, nontender. No adenopathy. No JVD. She probably has a murmur radiating to the neck area.   CHEST: Bilateral breath sounds are clear. No wheezing. Normal effort. Not using accessory muscles of respiration.   HEART: Heart sounds are regular. There is a murmur radiating throughout the precordium. Good peripheral pulses. No lower extremity edema.   ABDOMEN: She has right upper quadrant tenderness. She has significant tenderness even with minimal touch to the right upper quadrant area. Normal bowel sounds. No hepatosplenomegaly. No masses appreciable. She has abdominal bruit also. No guarding. No rigidity.   NEUROLOGIC: She is awake. She is alert. She knows that she is in the hospital. Moving all extremities against gravity.   EXTREMITIES: No cyanosis. No clubbing.   SKIN: Poor skin turgor.   LABORATORY, DIAGNOSTIC, AND RADIOLOGICAL DATA: White count 13.1, hemoglobin 10.5, platelet count 307,000. BMP sodium 139, potassium 4.1, BUN 23, creatinine 0.87, AST 241, though bilirubin, alkaline phosphatase, and ALT are normal. Lipase is normal at 377. Troponin is negative.   CT of the abdomen and pelvis shows lung bases are clear. Heart size is normal. Gallbladder severely distended with cholelithiasis and pericholecystic fluid concerning for acute cholecystitis. Mild intrahepatic and extrahepatic biliary dilatation.  Two EKGs shows sinus rhythm, normal axis, no acute ischemic changes. She has Q waves in septal leads.   IMPRESSION:  1. Preoperative evaluation.  2. Hypertension.  3. Hyperlipidemia.  4. History of CVA with left hemiparesis and previous TIA.   5. History of breast cancer status post mastectomy. 6. Previous history of partial gastrectomy now with acute cholecystitis with leukocytosis and thrombocytosis.  PLAN: This is a 81 year old female. She has no cardiac history in the past, no history of MI or congestive heart failure though she has history of stroke and she has history of carotid artery disease. She has history of breast cancer and previous partial gastrectomy because of peptic ulcer disease. She now comes in with nausea, vomiting, and right upper quadrant tenderness. She has acute cholecystitis with gallbladder being severely distended. Her bilirubin, alkaline phosphatase, and ALT are normal but her AST is elevated. Right now she has been admitted by Surgery for conservative management with fluids and IV antibiotics. Her Aggrenox has been stopped because of possible surgery. Her EKG does not show any acute ischemia. She is hemodynamically stable right now. She has no cardiac symptoms at this time. She is intermediate risk for noncardiac surgery because of her age of 9, her carotid artery disease and history of stroke but can proceed with surgery. Will continue to follow.   TIME SPENT WITH CONSULTATION: 50 minutes.    ____________________________ Mena Pauls, MD ag:drc D: 02/05/2012 14:59:38 ET T: 02/06/2012 10:27:11 ET JOB#: [id number]  cc: Mena Pauls, MD, <Dictator> Lorenza Evangelist, MD Mena Pauls MD ELECTRONICALLY SIGNED 02/14/2012 12:45

## 2014-12-21 NOTE — H&P (Signed)
PATIENT NAME:  Nina Jones, PLATO MR#:  [id number] DATE OF BIRTH:  Dec 28, 1914  DATE OF ADMISSION:  02/05/2012  PRIMARY CARE PHYSICIAN: Dr. Kem Kays.   ADMITTING PHYSICIAN: Dr. Pat Patrick.   CHIEF COMPLAINT: Abdominal pain.   BRIEF HISTORY: Ms. Jacqulyne Gladue is a 81 year old woman, a resident of an assisted living facility, presenting to the Emergency Room by EMS with the complaint of abdominal pain. Daughter is present for the interview as the patient is sedated and can really not participate much in her interview. The daughter reports that the patient has had some abdominal pain for the last 24 hours, but the nursing facility records suggest she has been having problems for several days. The pain is primarily in the upper abdomen and has been recently associated with some nausea and anorexia. She has not vomited at the present time. She has had no known fever. Her symptoms have worsened over the last 24 hours which prompted transfer to the hospital for further evaluation. Work-up in the Emergency Room revealed normal liver function studies with the exception of an elevated SGOT at 241. White blood cell count was 13,000, hemoglobin was 10.5. Lipase was not elevated. Urinalysis was largely unremarkable. CT scan of the abdomen was performed which demonstrated a large distended gallbladder with evidence of pericholecystic fluid, thickened gallbladder wall and small gallstones. Review of her previous CT scan performed four years ago demonstrates similar findings without the gallbladder wall thickening and pericholecystic fluid. Distended gallbladder and stones has been a chronic issue.   She has a previous history of peptic ulcer disease, having undergone a partial gastrectomy Billroth II reanastomosis many years ago for her symptomatic peptic ulcer disease. She has also had bilateral mastectomies for cancer, hysterectomy and an appendectomy. She has not had a recent colonoscopy. She had a sigmoidoscopy five years ago  which was unremarkable.   She denies any history of hepatitis, yellow jaundice, pancreatitis, or diverticulitis. She has not been told she has gallbladder disease before although it has been documented on the chart that she does have a history of cholelithiasis.   She is regularly cared for by the [facility] Internal Medicine service. She has history of hypertension, but no cardiac disease or diabetes. She does not smoke cigarettes and does not drink any alcohol. She is a resident of assisted living facility.   CURRENT MEDICATIONS:  1. Aggrenox 25/200 daily.  2. Norvasc 2.5 mg p.o. daily.  3. Omeprazole 20 mg p.o. daily.  4. Tylenol 325 p.o. q.4-6h. p.r.n.  5. Zocor 20 mg p.o. daily.   ALLERGIES: She reports an allergy by chart to codeine, Ambien, morphine and nonsteroidal anti-inflammatory drugs.   FAMILY HISTORY: Noncontributory.  REVIEW OF SYSTEMS: Ten point review of systems is not possible as the patient is sedated.   PHYSICAL EXAMINATION:    VITAL SIGNS: On admission revealed a blood pressure 164/57, heart rate is 95 and regular. She is arousable but sleeping when not stimulated.   HEENT: Examination is unremarkable. Pupils are small. There is no scleral icterus. She has no facial deformity.   NECK: Supple without adenopathy.   CHEST: Clear, but she has very distant breath sounds. Little pulmonary excursion at the present time.   CARDIAC: 3/6 systolic murmur heard best along the left sternal border. There is a blowing type murmur. Cardiac rhythm appears to be normal.   ABDOMEN: Her abdomen is soft with no significant abdominal tenderness. She does have some minor guarding on deep palpation in the right upper  quadrant, but otherwise is nonreactive. She has hypoactive bowel sounds. She does have a loud abdominal bruit. No hernias or masses are identified.   EXTREMITIES: Lower extremity exam reveals very poor distal pulses. No deformities.   PSYCHIATRIC: Impossible to  perform at the present time with the patient sedated.   IMPRESSION AND PLAN: I have independently reviewed her CT scan. Her CT scan changes due suggest an inflammatory problem with the gallbladder. However, the overall gallbladder CT is not significantly different than it was five years ago other than the inflammatory changes. She was deemed not to be a surgical candidate at that time. Will plan to admit her to the hospital, place her on IV antibiotics, hydrate her and see how she does over the next several days. Percutaneous cholecystostomy would be an option if her symptoms don't improve. I think with the previous gastric and abdominal surgery, she would be a difficult laparoscopic procedure. I have outlined this plan to the patient's daughter and she is in agreement. We will arrange for internal medicine consult to assist [place] with risk assessment.   ____________________________ Micheline Maze, MD rle:ap D: 02/05/2012 13:52:02 ET T: 02/05/2012 14:31:56 ET JOB#: [id number]  cc: Micheline Maze, MD, <Dictator> Lorenza Evangelist, MD Rodena Goldmann MD ELECTRONICALLY SIGNED 02/05/2012 18:44

## 2014-12-21 NOTE — Discharge Summary (Signed)
PATIENT NAME:  Nina Jones, Nina Jones MR#:  [id number] DATE OF BIRTH:  [birth year]  DATE OF ADMISSION:  02/05/2012 DATE OF DISCHARGE:  02/08/2012  BRIEF HISTORY: [name] is a 81 year old woman seen in the Emergency Room with signs and symptoms consistent with acute cholecystitis. She had a markedly distended gallbladder with thickened gallbladder wall, pericolic fluid and obvious gallstones. She had been evaluated several years ago and felt not qualified to be a surgical candidate at that time. She presented back to the Emergency Room and our Emergency Room physicians recommend surgical evaluation for possible cholecystectomy. She had been sick for several days prior to her admission. Laboratory values revealed normal liver function studies with the exception of slightly elevated SGOT at 241. White blood cell count was 13,000. Electrolytes were largely unremarkable. Comparing her CT scan to one several months ago, the gallbladder was still quite enlarged although on the current exam there was evidence of some free fluid around the gallbladder. Pericholecystic inflammatory change was suspected. Because of the patient's general health, nursing home status and age we planned to admit her to the hospital and attempt nonoperative therapy. She was placed on IV antibiotics, IV rehydration and clear liquids. Her symptoms improved over the next 48 hours. White blood cell count came down to normal. She had no fever or chills. Abdominal pain improved. She was advanced to a soft diet. She was discharged home on the 12th to be followed by her primary care doctor. Surgical intervention was not recommended. Should she have symptoms in the future perhaps percutaneous cholecystostomy would be of benefit.   DISCHARGE MEDICATIONS:  1. Zocor 20 mg p.o. daily.  2. Norvasc 2.5 mg p.o. daily.  3. Aggrenox 25/200 mg b.i.d.  4. Vitamin B12 1000 mcg daily.  5. Augmentin 500 mg p.o. b.i.d.  6. Ultram 50 mg p.o. q.6 p.r.n.   7. Protonix 40 mg p.o. daily.  8. Os- Cal D 1 p.o. daily.      FINAL DISCHARGE DIAGNOSIS: Acute cholecystitis.   ____________________________ Micheline Maze, MD rle:cms D: 02/13/2012 16:43:29 ET T: 02/13/2012 17:24:27 ET JOB#: [id number]  cc: Micheline Maze, MD, <Dictator> Lorenza Evangelist, MD  Rodena Goldmann MD ELECTRONICALLY SIGNED 02/15/2012 7:43

## 2014-12-21 NOTE — Consult Note (Signed)
Brief Consult Note: Diagnosis: Preop Evaluation, h/o CVA, carotid artery disease, HTN , hyperlipidemia, h/o breast cancer, h/o partial gastrectomy.   Patient was seen by consultant.   Consult note dictated.   Recommend to proceed with surgery or procedure.   Comments: 89/f no cardiac history, no h/o MI, CHF, h/o CVA with carotid artery disease presented with nausea/ vomiting and RUQ tenderness , noted to have distended GB  with pericholecystic fluid consistent with acute cholecystitis , currently on iv hydration nad iv abx EKG no acute ischemia , previous ECHO in 2011 s/o EF of 50% , no cardiac symptoms , aggrenox has been suspended for possible surgery, can be restarted post op when safe to do so , intermediate risk for procedure, cont norvasc, statin.  Electronic Signatures: Mena Pauls (MD)  (Signed 09-Jun-13 15:04)  Authored: Brief Consult Note   Last Updated: 09-Jun-13 15:04 by Mena Pauls (MD)

## 2014-12-28 NOTE — Consult Note (Signed)
PATIENT NAME:  Nina Jones, Nina Jones MR#:  [id number] DATE OF BIRTH:  04-30-1915  DATE OF CONSULTATION:  11/29/2014  CONSULTING PHYSICIAN:  Simonne Come. Kwamane Whack, MD  HISTORY OF PRESENT ILLNESS: The patient is a 81 year old patient, resident of [facility], who came to the hospital and was admitted today with general weakness, abdominal discomfort, constipation and had a temperature of 100.9, low blood pressure of 85/59 and oxygen saturation was 91%. Also had leukocytosis, elevated liver function tests and mild elevation in AST. Had a CT of abdomen and pelvis that showed multiple liver masses and nodes in the porta hepatis, some multiple pulmonary nodules, suspicious for metastatic cancer. The patient was given IV fluid and was hospitalized with additional fluid support and IV antibiotics.   PAST MEDICAL HISTORY: There is a past history of hypertension, hyperlipidemia and breast cancer in the past, 40 years ago treated with mastectomy. There is osteoporosis, TIAs, carotid artery disease, recurrent cholecystitis and also peptic ulcer disease.   PAST SURGICAL HISTORY: The patient had partial gastrectomy or subtotal gastrectomy for peptic ulcer disease in the past. Also include double mastectomy, appendectomy, hysterectomy and bilateral hip repair.   ALLERGIES: CODEINE, AMBIEN, MORPHINE AND NONSTEROIDAL ANTI-INFLAMMATORIES.   FAMILY HISTORY: It is said that the father had some type of cancer.   SOCIAL HISTORY: No alcohol or tobacco. Lives at [facility] in nursing care. According to the granddaughter, when the patient is doing well, she is mobile with a walker.   MEDICATIONS AT HOME: Albuterol every 6 hours, Celexa 5 mg a day, Ensure supplement, Phenergan p.r.n. for nausea and vomiting, Tylenol p.r.n. and oral vitamin B12.   SYSTEM REVIEW: The patient is hard of hearing. She is slightly lethargic. She is denying any pain now, although she had had some abdominal tenderness. She has general weakness. She denied  vision disturbances, not coughing, not out of breath, denies chest pain, does not have easy bleeding or bruising.   PHYSICAL EXAMINATION: GENERAL: Elderly frail appearing patient, some pallor. No jaundice.  NECK: No mass in the neck.  LYMPH: No palpable lymph nodes in the axilla or submandibular.  HEART: Regular.  ABDOMEN: Firm and slightly distended, slightly tympanitic. There is tenderness with no guarding in the midabdomen.  EXTREMITIES: No extremity edema. NEUROLOGIC: Moves all extremities. I did not test strength. Cranial nerves are intact. Is currently on oxygen.   LABORATORIES ON ADMISSION: The creatinine is 1.09, potassium 4.3. Albumin was 3.3, bilirubin 0.6, alkaline phosphatase 160, AST 63, ALT 51. White count 15.9, hemoglobin 11.9, platelets 248,000. Urinalysis was abnormal.   CT scan of the abdomen and pelvis with contrast shows multiple low attenuation liver lesions. Mild dilatation of extra and intrahepatic biliary ducts, compression on the upper bile duct with apparent porta hepatic mass or lymph nodes, multiple porta hepatis lymph nodes, mild dilatation of the abdominal small bowel, multiple renal cysts, multiple subcentimeter pulmonary nodules and a lingula mass of 1.6 cm. There was also multiple thoracic and lumbar vertebral compressions.   IMPRESSION: From the oncology point of view, the patient has metastatic cancer. No known histology. The patient did have a breast cancer 40 years ago. This appearance looks more like a primary gastroinestinal cancer or a primary lung cancer. Metastatic breast cancer is a possibility. The patient advanced age and multiple comorbidities at baseline is frail. Has had some recent hospitalizations in the hospital since last September. Cannot tolerate any aggressive measures. Would be at usual risk. although should be a relatively low risk for a  CT-guided FNA or a core liver biopsy. The patient would theoretically have treatment option of oral targeted  therapy for non-small cell lung cancer or intravenous checkpoint inhibitor therapy for a primary non-small cell lung cancer and would have the option of oral hormonal therapy for the ER positive breast cancer. Otherwise, no options for other primary disorders. It is reasonable to pursue comfort care only and get a palliative care consult. I have ordered tumor markers and if they point away from primary gastroinestinal, than the patient potentially has options as above. Again, need to discuss with family members, caregiver and gauge patient's preference. It is certainly unlikely that any interventions would to be effective or could have any prolonged positive effect given the extensive disease.      ____________________________ Simonne Come. Inez Pilgrim, MD rgg:TT D: 11/29/2014 16:10:26 ET T: 11/29/2014 17:07:56 ET JOB#: [id number]  cc: Simonne Come. Inez Pilgrim, MD, <Dictator> Dallas Schimke MD ELECTRONICALLY SIGNED 12/02/2014 16:11

## 2014-12-28 NOTE — Consult Note (Signed)
PATIENT NAME:  Nina Jones, SIRICO MR#:  [id number] DATE OF BIRTH:  01-10-1915  DATE OF CONSULTATION:  12/01/2014  REFERRING PHYSICIAN:  Manuella Ghazi CONSULTING PHYSICIAN:  Cheral Marker. Ola Spurr, MD  REASON FOR CONSULTATION: Gram-positive bacteremia and sepsis.  HISTORY OF PRESENT ILLNESS:  This is a pleasant 81 year old female admitted April 2 from [place] home with complaints of weakness and not feeling well and abdominal discomfort. She was found to have a temperature of 100.9 in the ED and be tachycardic and hypotensive. Her saturations were also low. She had an elevated white count of 15.9. The patient was admitted and had a CT abdomen and pelvis done which showed multiple liver masses, likely metastatic. The patient has been started on IV antibiotics with vancomycin and Zosyn. She has been seen by oncology as well as palliative care. We are consulted for further antibiotic management.   PAST MEDICAL HISTORY:  1.  Hypertension, hyperlipidemia, breast cancer status post mastectomy.  2.  Osteoporosis.  3.  Degenerative joint disease.  4.  TIA.  5.  Hard of hearing.  6.  Peptic ulcer disease status post gastric surgery.  7.  Recurrent cholecystitis.   PAST SURGICAL HISTORY: Double mastectomy, partial gastrectomy, appendectomy, hysterectomy, bilateral hip repair.   FAMILY HISTORY: Noncontributory.   SOCIAL HISTORY: Lives at [facility].  No tobacco, alcohol or drugs.   ALLERGIES: CODEINE, AMBIEN, MORPHINE, NSAIDs.    ANTIBIOTICS SINCE ADMISSION:  Vancomycin and Zosyn.   REVIEW OF SYSTEMS:  Unable to be obtained as the patient is somewhat lethargic.   PHYSICAL EXAMINATION:  VITAL SIGNS: Temperature 99.6, pulse is 92, blood pressure 167/78 although blood pressure this morning was 95/60, respirations are 19, saturation 95% on 2 liters.  GENERAL:  She is quite thin, in no acute distress, but chronically ill-appearing.  HEENT: Pupils reactive. Sclerae anicteric.  OROPHARYNX: Mucous membranes  are dry.  NECK: Supple.  HEART: Regular.  LUNGS: Clear.  ABDOMEN: Soft, mildly tender to palpation diffusely. She has multiple abdominal scars.  EXTREMITIES: No clubbing, cyanosis, or edema.   LABORATORY DATA: White count on admission was 15.9 with a neutrophil count of 15.1, currently 10.9 with a neutrophil count of 10.4, hemoglobin 9.7, platelets 197,000. Blood cultures 1 of 2 grew coag-negative Staphylococcus from April 1. Urine culture was mixed bacteria suggesting contamination. Urinalysis April 1 had 156 white cells. Renal function showed a creatinine of 0.79 down from 1.09 on admission. LFTs showed alkaline phosphatase 170, AST 63, albumin 3.3.   IMAGING:  CT of the abdomen and pelvis April 1 showed multiple liver mass, likely hematogenous metastases. Malignant-appearing upper abdominal adenopathy. There is intrahepatic bile duct dilatation with apparent compression of the upper common bile duct. There are multiple pulmonary nodules, question lingular mass. There are multiple lower thoracic and lumbar vertebral compressions of indeterminate age.   IMPRESSION: A 81 year old female with likely metastatic cancer of unknown primary admitted April 2 with weakness and fever of 100.9, and white blood count of 15. She did have a positive urinalysis, but urine culture is mixed suggesting contamination. Blood cultures 1 of 2 grew coag-negative Staphylococcus.   RECOMMENDATIONS:  1.  Discontinue broad spectrum antibiotics. I do not believe her coag-negative Staphylococcus in 1 of 2 cultures is clinically significant.  2.  I would change Zosyn to Augmentin for a total 10 day course from admission. Stop date would be April 12. This will cover intraabdominal pathogens as well as common urinary pathogens.  3.  If she worsens, would repeat  urinalysis and urine culture with in and out catheter.  4.  Thank you for the consult. I be glad to follow with you.   ____________________________ Cheral Marker.  Ola Spurr, MD dpf:sp D: 12/01/2014 16:50:58 ET T: 12/01/2014 17:15:17 ET JOB#: [id number]  cc: Cheral Marker. Ola Spurr, MD, <Dictator> Marx Doig Ola Spurr MD ELECTRONICALLY SIGNED 12/02/2014 0:29

## 2014-12-28 NOTE — Discharge Summary (Signed)
PATIENT NAME:  Nina Jones, Nina Jones MR#:  [id number] DATE OF BIRTH:  May 02, 1915  DATE OF ADMISSION:  11/29/2014 DATE OF DISCHARGE:  12/02/2014  DISCHARGE DIAGNOSES:  1.  Sepsis due to urinary tract infection. 2.  Multiple liver and lymph node masses suspicious for malignancy and 1.6 cm lung mass worrisome for malignancy.   SECONDARY DIAGNOSIS:   1.  Hypertension.  2.  Hyperlipidemia.  3.  History of breast cancer in the remote past, status post mastectomy.  4.  Osteoporosis.  5.  Degenerative joint disease.  6.  History of TIA/bilateral carotid artery disease.  7.  Hard of hearing.  8.  Peptic ulcer disease, status post gastric surgery.  9.  History of recurrent cholecystitis, deferred surgical management.   CONSULTATIONS:   1.  Oncology Dr. Barbette Reichmann.  2.  Infectious disease Dr. Adrian Prows.  2.  Palliative care Dr. Izora Gala Phifer.   PROCEDURES AND RADIOLOGY:   1.  Chest x-ray on April 1 showed a 1.6 cm nodular opacity at the left lung base increased in the size, mild right basilar atelectasis.  2.  CT scan of the abdomen and pelvis on April 1 showed multiple liver masses, likely hematogenous metastasis. Malignant-appearing upper abdominal adenopathy primarily in the porta hepatis. Intrahepatic bile duct dilatation with compression of upper CBD. Compression of the portal vein between the 2 porta hepatis nodes in the same area. Multiple pulmonary nodules. Small right pleural effusion. Multiple lower thoracic and lumbar vertebral compression fractures of indeterminate age.  3.  Chest x-ray on April 4 showed moderate right-sided pleural effusion increased in size. New small left pleural effusion. No discrete infiltrate.   MAJOR LABORATORY PANEL:   1.  UA on admission showed 1+ bacteria, WBCs in clumps, 156 WBCs, 3+ leukocyte esterase.  2.  Urine culture was contaminated.  3.  Blood culture had coagulase-negative Staphylococcus in 1 out of 2 cultures which was likely not clinically  significant.   HISTORY AND SHORT HOSPITAL COURSE:  The patient is a 81 year old female with the above-mentioned medical problems who was admitted for generalized weakness and abdominal discomfort.  Was febrile with leukocytosis and was found to have lactic acidosis with hypotension worrisome for severe sepsis. Please see Dr. Reddy's dictated history and physical for further details. Oncology consultation was obtained with Dr. Barbette Reichmann who felt the patient's cancer to be metastatic and unlikely to be curable at this age.  Palliative care consultation was obtained with Dr. Izora Gala Phifer who met with family to discuss long-term quality of patient's life with metastatic cancer and possible workup. Family decided not to let patient undergo any further treatment and/or biopsy and chose for hospice services at [facility] where she is being discharged. Infectious disease consultation was obtained with Dr. Adrian Prows who recommended treatment with oral antibiotic for possible UTI and sepsis.  She was discharged to [facility] in fair condition under hospice care.  On the date of discharge, her vital signs were as follows: Temperature 98.4, heart rate 92 per minute, respirations 19 per minute, blood pressure 165/83. She is saturating 94% on 2 liters oxygen via nasal cannula.   PERTINENT PHYSICAL EXAMINATION ON THE DATE OF DISCHARGE:  CARDIOVASCULAR: S1, S2 normal. No murmurs, rubs, or gallop.  LUNGS: Clear to auscultation bilaterally. No wheezing, rales, rhonchi, or crepitation.  ABDOMEN: Soft, benign  NEUROLOGIC: She was lethargic and poorly responsive.  All other physical examination remained at baseline.   DISCHARGE MEDICATIONS:   Medication Instructions  oyster shell  calcium with vitamin d 500 mg-200 intl units oral tablet  1 tab(s) orally 2 times a day for calcium supplement.   vitamin b-12 500 mcg oral tablet  1 tab(s) orally once a day   tylenol 325 mg oral tablet  2 tab(s) orally 2 times a day    ensure clear  1   carton three times a day po   albuterol 2.5 mg/3 ml (0.083%) inhalation solution  3 milliliter(s) inhaled every 6 hours, As Needed   phenergan 25 mg rectal suppository  1 suppository(ies) rectal every 4 hours, As Needed - for Nausea, Vomiting   celexa 10 mg oral tablet  0.5 tab(s) orally once a day   tylenol 500 mg oral tablet  1 tab(s) orally 2 times a day, As Needed   amoxicillin-clavulanate  875 milligram(s) orally 2 times a day x 7 days    DISCHARGE DIET: Regular.   DISCHARGE ACTIVITY: As tolerated.  DISCHARGE INSTRUCTIONS AND FOLLOWUP:  The patient was instructed to follow up with her primary care physician, Dr. Frazier Richards, in 1 to 2 days. She will get hospice services to follow her at the facility.   TOTAL TIME DISCHARGING THIS PATIENT: 45 minutes.   She remains very critical. Her code status is DO NOT RESUSCITATE and she remains at high risk for readmission.   ____________________________ Lucina Mellow. Manuella Ghazi, MD vss:sp D: 12/02/2014 16:35:23 ET T: 12/02/2014 16:49:54 ET JOB#: [id number]  cc: Angelin Cutrone S. Manuella Ghazi, MD, <Dictator> Ocie Cornfield. Ouida Sills, MD Efraim Kaufmann, MD Simonne Come. Inez Pilgrim, MD Cheral Marker. Ola Spurr, MD Lucina Mellow [facility] MD ELECTRONICALLY SIGNED 12/03/2014 11:11

## 2014-12-28 NOTE — Consult Note (Signed)
   Comments   I met with pt's two daughters and son-in-law. Family updated. CA 19-9 noted. Family say their primary goal is pt's comfort. They recognize that patient likely has metastatic cancer. We talked about further workup and they are not keen to do this. Family would likely not want treatment even if they had a + biopsy. Alternatively, we talked about hsopice involvement at [facility] and family want to pursue this. Will place hospice screening.  confirm [facility] say that patient does NOT have dementia despite medical record reflecting this.  Electronic Signatures: Amish Mintzer, Kirt Boys (NP)  (Signed 04-Apr-16 14:43)  Authored: Palliative Care Phifer, Izora Gala (MD)  (Signed 04-Apr-16 17:12)  Authored: Palliative Care   Last Updated: 04-Apr-16 17:12 by Phifer, Izora Gala (MD)

## 2014-12-28 NOTE — H&P (Signed)
PATIENT NAME:  Nina Jones, Nina Jones MR#:  [id number] DATE OF BIRTH:  December 14, 1914  DATE OF ADMISSION:  11/29/2014  REFERRING PHYSICIAN: Debbrah Alar, MD  PRIMARY CARE PRACTITIONER: Ocie Cornfield. Ouida Sills, MD, of [facility].   ADMITTING PHYSICIAN: Juluis Mire, MD   CHIEF COMPLAINT:  1.  General weakness.  2.  Abdominal pain.   HISTORY OF PRESENT ILLNESS: A 81 year old Caucasian female a resident of [place] on DNR status was brought by EMS staff with the complaints of generalized weakness and not feeling well and having some abdominal discomfort with no bowel movement for the past few days. In the Emergency Room on arrival, the patient was noted to have a temperature of 100.9 degrees Fahrenheit with a pulse rate of 118 per minute, and hypotension with a blood pressure of 85/59 and oxygen saturation of 91% room air. The patient was evaluated by the ED physician and was noted to have elevated white blood cell count of 15.9, serum lactic acid of 2.9, and a BUN and creatinine of 38/1.09 and elevation of liver functions with alkaline phosphatase of 160, AST of 63. CT of the abdomen and pelvis revealed multiple liver masses and lymph node masses suspicious for malignancies. The patient was given 3 L of normal saline and blood cultures were obtained. The patient was started on broad spectrum antibiotics, namely vancomycin and Zosyn. Hospitalist service was consulted for further evaluation and management. The patient continued to have low blood pressure despite receiving 3 L of normal saline, but remained alert, awake, and oriented and was continued on IV fluids. Chest x-ray did not reveal any pneumonia but showed a 1.6 cm nodule, which needs to be evaluated. It was decided to start the patient on Levophed because of concern of persistent hypotension, but meanwhile, the patient's blood pressure improved, and currently maintaining around 120/70.    PAST MEDICAL HISTORY:  1.  Hypertension.   2.  Hyperlipidemia.  3.  History of breast cancer in the remote past, status post mastectomy.  4.  Osteoporosis.  5.  Degenerative joint disease.  6.  History of TIA/bilateral carotid artery disease.  7.  Hard of hearing.  8.  Peptic ulcer disease, status post gastric surgery.  9.  History of recurrent cholecystitis, deferred surgical management.   PAST SURGICAL HISTORY:  1.  Double mastectomy.  2.  Partial gastrectomy.  3.  Appendectomy.  4.  Hysterectomy.  5.  Bilateral hip repair.   ALLERGIES: CODEINE, AMBIEN, MORPHINE, AND NONSTEROIDAL ANTI-INFLAMMATORIES.   FAMILY HISTORY: Significant for father with history of some cancers.   SOCIAL HISTORY: She is a resident of [facility]. Denies any history of smoking, alcohol, or substance abuse.    HOME MEDICATIONS:  1.  Albuterol inhalation solution every 6 hours as needed.  2.  Celexa 10 mg half tablet orally once a day.  3.  Ensure 1 can 3 times a day.  4.  Phenergan 25 mg rectal suppository p.r.n. for nausea or vomiting.  5.  Tylenol 325 mg 2 tablets orally 2 times a day.  6.  Vitamin B12 at 500 mcg oral tablet 1 tablet orally once a day.   REVIEW OF SYSTEMS:  CONSTITUTIONAL: Negative for fever or chills. Positive for generalized weakness.  EYES: Negative for blurred vision, double vision. No pain. No redness. No discharge.  EARS, NOSE, AND THROAT: Negative for tinnitus, ear pain. History of hearing loss; the patient is hearing impaired.  RESPIRATORY: Negative for cough, wheezing,  dyspnea, hemoptysis, or painful respiration.  CARDIOVASCULAR: Negative for chest pain, palpitations, dizziness, syncopal episodes, orthopnea, pedal edema, or dyspnea.  GASTROINTESTINAL: Positive for abdominal pain with not having any bowel movements. No nausea. No vomiting. No diarrhea.  GENITOURINARY: Negative for dysuria, frequency, urgency.  ENDOCRINE: Negative for polyuria, nocturia, heat or cold intolerance.  HEMATOLOGIC AND  LYMPHATIC: Negative for easy bruising, bleeding, or swollen glands.  INTEGUMENTARY: Negative for acne, skin rash, or lesions.  MUSCULOSKELETAL: Negative for neck or back pain.  NEUROLOGICAL: Negative for focal weakness of numbness. History of TIA in the past.  PSYCHIATRIC: Negative for anxiety, insomnia, depression.   PHYSICAL EXAMINATION:  VITAL SIGNS: Temperature 100.9 degrees on arrival; pulse rate 118 per minute; respirations 24 per minute; blood pressure on arrival was 131/68, later on it decreased to 74/57, and current blood pressure is 125/59; pulse rate of 154.  GENERAL: Elderly female awake and alert, following commands, not in any acute distress.  HEAD: Atraumatic, normocephalic.  EYES: Pupils equal, react to light and accommodation. No icterus.  NOSE: No drainage.   EARS: No drainage. No external lesions.  ORAL CAVITY: No mucosal lesions. No exudates.   NECK: Supple. No JVD. No thyromegaly. No carotid bruit. Range of motion of neck within normal limits.  RESPIRATORY: Good respiratory effort. Not using accessory muscles of respiration. Bilateral vesicular breath sounds present. No rales or rhonchi.  CARDIOVASCULAR: S1, S2 regular. No murmurs, gallops, or clicks. Pulses equal at carotid, femoral, and pedal pulses. No peripheral edema.  GASTROINTESTINAL: Abdomen is soft. Mild distention present. Mild tenderness present. No guarding. No rigidity. Bowel sounds present and equal in all 4 quadrants. No hepatosplenomegaly.  GENITOURINARY: Deferred.  MUSCULOSKELETAL: No joint tenderness or effusion. Range of motion is adequate.  SKIN: Inspection within normal limits.  LYMPHATIC: No cervical lymphadenopathy.  VASCULAR: Good dorsalis pedis and posterior tibial pulses.  NEUROLOGIC: Alert, awake, and oriented x 3. Cranial nerves II-XII grossly intact. No sensory deficit. Motor strength is 5/5, equal in both upper and lower extremities.  PSYCHIATRIC: Alert, awake, and oriented x 3. Judgment and  insight adequate. Memory could not be tested.   ANCILLARY DATA:  LABORATORY DATA: Glucose 127, BUN 38, creatinine 1.09, sodium 138, potassium 4.3, chloride 106, bicarbonate 22, calcium 9.3, lipase 46, lactic acid venous 2.9. Total protein 6.5, albumin 3.3, total bilirubin 0.6, alkaline phosphatase 160, AST 63, ALT 51. WBC 15.9, hemoglobin 11.9, hematocrit 36.0, platelet count 248,000. Urinalysis: WBC 156 per high-power field, bacteria 1+.   IMAGING:  1.  CT of the abdomen and pelvis:  A.  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  B.  Malignant-appearing upper abdominal adenopathy primarily in the porta hepatis.  C.  Intrahepatic bile duct dilatation with apparent compression of upper CBD. There is also compression of portal vein.   D.  Multiple pulmonary nodules.  E.  Multiple lower thoracic vertebral compressions of indeterminate age.   2.  Chest x-ray:  A.  A 1.6 cm nodular opacity at the left lung base, increased in size from 2015.  B.  Mild right basilar atelectasis noted   ASSESSMENT AND PLAN: A 81 year old Caucasian female, a nursing home resident on DO NOT RESUSCITATE status with history of hypertension, hyperlipidemia, remote history of breast cancer, status post mastectomy, osteoporosis, degenerative joint disease, history of transient ischemic attack, hard of hearing, recurrent cholecystitis, presents with the complaints of generalized weakness and abdominal pain, found to be febrile with elevated white blood cell count and hypotension with elevated lactic acid concerning  for severe sepsis. Also, noted to have a urinary tract infection.  1.  Severe sepsis secondary due to urinary tract infection. Plan: Admit to medical floor. Panculture. Continue vancomycin and Zosyn. Follow cultures and CBC.  2.  Multiple liver and lymph node masses suspicious for malignancy and 1.6 cm lung mass suspicious for malignancy. Needs workup. History of remote breast cancer status post mastectomy.  Request oncology consult. 3.  History of breast cancer status post mastectomy.  4.  Hypertension. Blood pressure low on presentation. We will hold off blood pressure medications for now. Monitor blood pressure closely.  5.  Deep vein thrombosis prophylaxis. Subcutaneous Lovenox.  6.  Gastrointestinal prophylaxis. Proton pump inhibitor.    CODE STATUS: DNR, which is confirmed with the patient's daughters, who are with the patient at this time. Pts daughters opining towards non-aggressive approach, will request palliative care consult to identify gaols of treatment.  TIME SPENT: 55 minutes.    ____________________________ Juluis Mire, MD enr:bm D: 11/29/2014 02:48:57 ET T: 11/29/2014 06:21:50 ET JOB#: [id number]  cc: Juluis Mire, MD, <Dictator> Ocie Cornfield. Ouida Sills, MD Juluis Mire MD ELECTRONICALLY SIGNED 11/29/2014 20:03

## 2014-12-28 NOTE — Consult Note (Signed)
Brief Consult Note: Diagnosis: no acute complaints.   Consult note dictated.   Comments: SEE DICTATED NOTE. MOST LIKELY, PALLIATIVE CARE AND NO SPECIFIC ANTINEOPLASTIC TX.  THIS APPEARS TO BE A GI ORIGIN CANCER BUT METASTATIC LUNG OR BREAST ARE POSSIBLE. AGE AND COMORBIDITY PRECLUDES AGRESSIVE TX. LUNG CANCERS HAVE POSSIBLE TARGETED TX AND IMMUNE TX OPTIONS, BREAST CANCER HAS ANTIHORMONE TX OPTION....WILL CHECK TUMOR MARKERS, AND F/U.  Electronic Signatures: Dallas Schimke (MD)  (Signed 02-Apr-16 16:00)  Authored: Brief Consult Note   Last Updated: 02-Apr-16 16:00 by Dallas Schimke (MD)

## 2015-01-13 DIAGNOSIS — I70233 Atherosclerosis of native arteries of right leg with ulceration of ankle: Secondary | ICD-10-CM

## 2015-02-02 ENCOUNTER — Telehealth: Payer: Self-pay

## 2015-02-02 NOTE — Telephone Encounter (Signed)
PLEASE NOTE: All timestamps contained within this report are represented as [place] Standard Time. CONFIDENTIALTY NOTICE: This fax transmission is intended only for the addressee. It contains information that is legally privileged, confidential or otherwise protected from use or disclosure. If you are not the intended recipient, you are strictly prohibited from reviewing, disclosing, copying using or disseminating any of this information or taking any action in reliance on or regarding this information. If you have received this fax in error, please notify us immediately by telephone so that we can arrange for its return to [place]. Phone: [phone], Toll-Free: [phone], Fax: [phone] Page: 1 of 1 Call Id: [id number] [place] Patient Name: Nina Jones Gender: Female DOB: 1914/12/23 Age: 81 Y 71 M 10 D Return Phone Number: Address: City/State/Zip: [place] Client [place] Night - Client Client Site [place] Physician Viviana Simpler Contact Type Call Call Type Page Only Caller Name Caryl Pina at [facility] Relationship To Patient Provider Is this call to report lab results? No Return Phone Number Please choose phone number Initial Comment Caller states Caryl Pina at [facility], CB# [phone] her patient has been coughing when drinking water, she is needing to get an order for her to be on thickened liquids, and if that is ok for her. Nurse Assessment Guidelines Guideline Title Affirmed Question Affirmed Notes Nurse Date/Time (Eastern Time) Disp. Time Eilene Ghazi Time) Disposition Final User 02/01/2015 3:52:57 PM Send to [place], [place] 02/01/2015 3:54:48 PM Paged On Call to Other Provider Ricard Dillon 02/01/2015 3:55:01 PM Page Completed Yes Ricard Dillon After Care Instructions Given Call Event Type User Date /  Time Description Paging DoctorName Phone DateTime Result/Outcome Message Type Notes Alysia Penna [phone] 02/01/2015 3:54:48 PM Paged On Call to Other Provider Doctor Paged Please call Caryl Pina at [facility] regarding Doneta Bayman at [phone] Alysia Penna 02/01/2015 3:54:52 PM Paged On Call to Another Provider Message Result

## 2015-02-02 NOTE — Telephone Encounter (Signed)
I took care of this at [facility] today Failing from metastatic cancer and on hospice

## 2015-02-27 DEATH — deceased

## 2015-06-16 ENCOUNTER — Encounter: Payer: Self-pay | Admitting: Cardiology

## 2016-05-14 IMAGING — CT CT ABD-PELV W/ CM
2 of 5 series · 15 of 46 positions shown, 17 images · IV contrast (omnipaque)
Comparison: 02/05/2012

CLINICAL DATA: Generalized abdominal pain for several days.

EXAM:
CT ABDOMEN AND PELVIS WITH CONTRAST
TECHNIQUE: Multidetector CT imaging of the abdomen and pelvis was performed
using the standard protocol following bolus administration of
intravenous contrast.
CONTRAST:  80 mL Omnipaque 300 intravenous

[Series 2: routine abd pel with · axial · 0.65mm/px · z∈[-912,-562]mm · 12 of 80 slices shown, 14 images]
[im 5/80  soft-tissue]
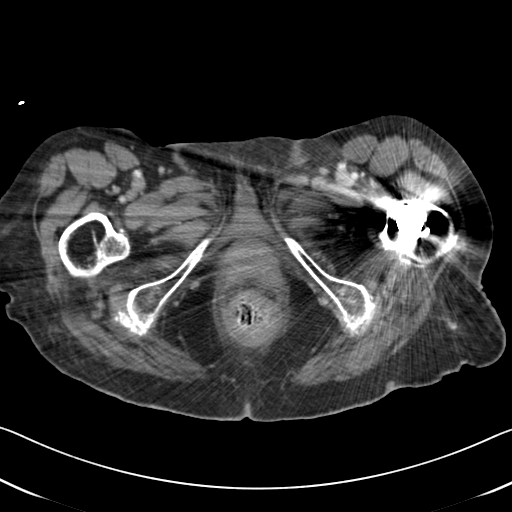
[im 5/80  bone]
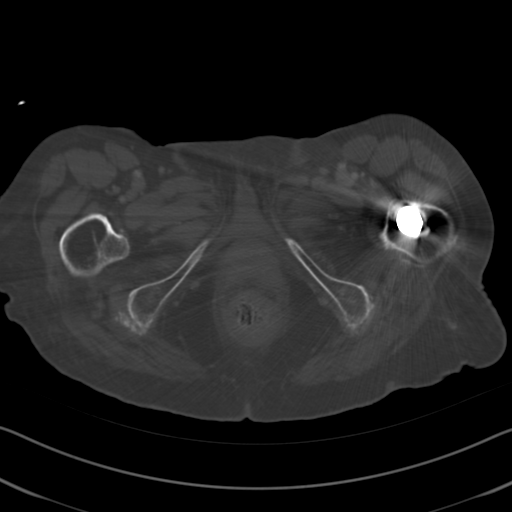
[im 14/80  soft-tissue]
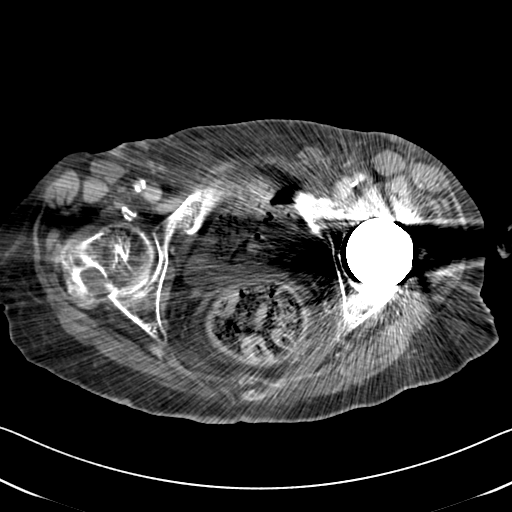
[im 18/80  soft-tissue]
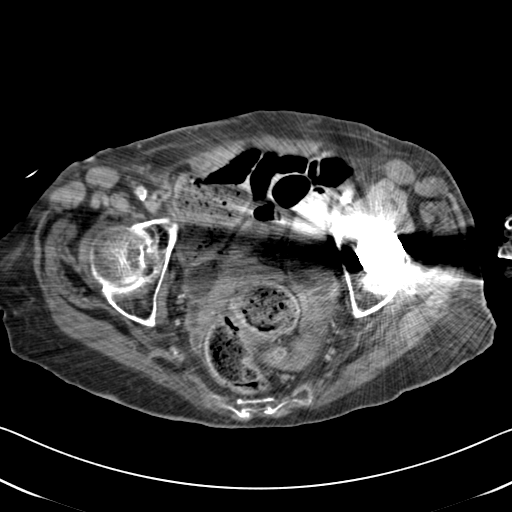
[im 22/80  soft-tissue]
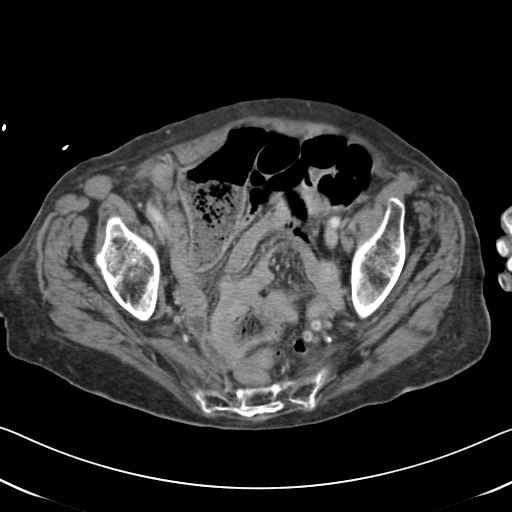
[im 31/80  soft-tissue]
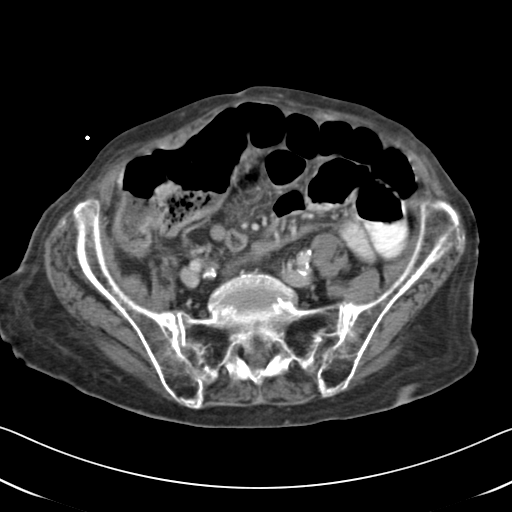
[im 36/80  soft-tissue]
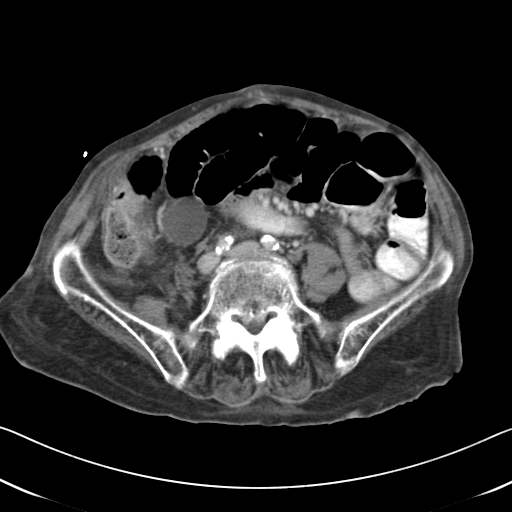
[im 44/80  soft-tissue]
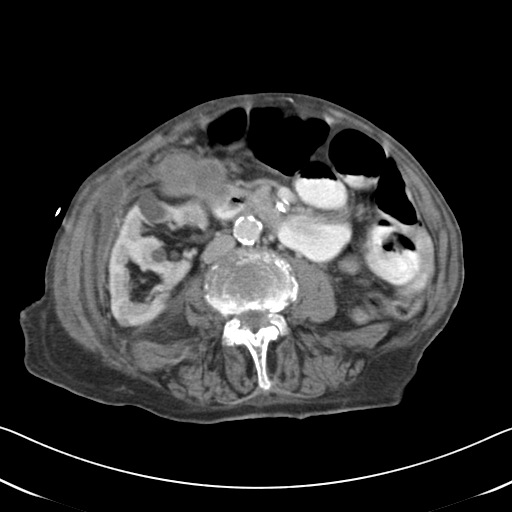
[im 49/80  soft-tissue]
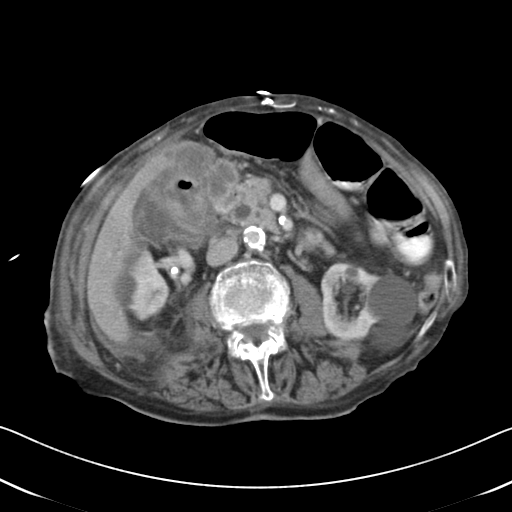
[im 58/80  soft-tissue]
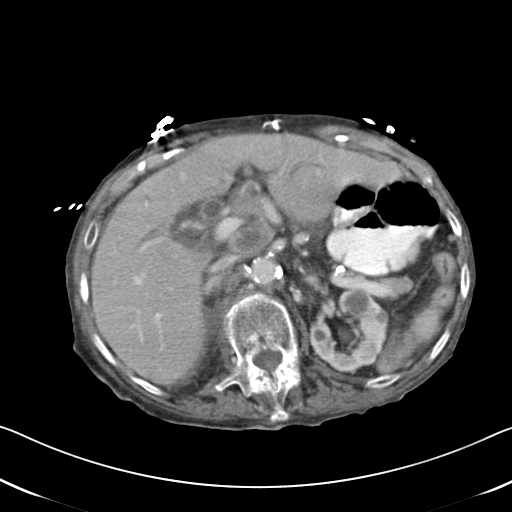
[im 58/80  bone]
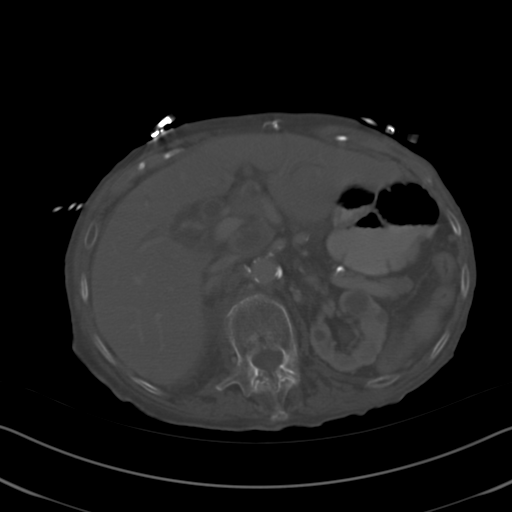
[im 62/80  soft-tissue]
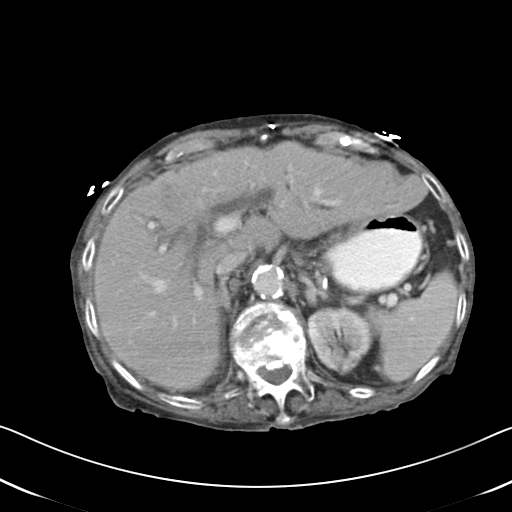
[im 66/80  soft-tissue]
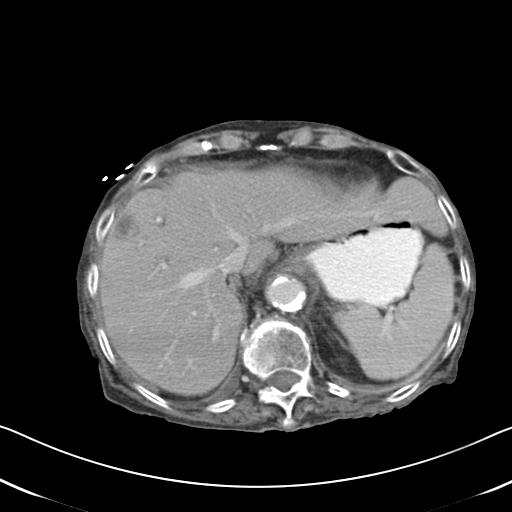
[im 75/80  soft-tissue]
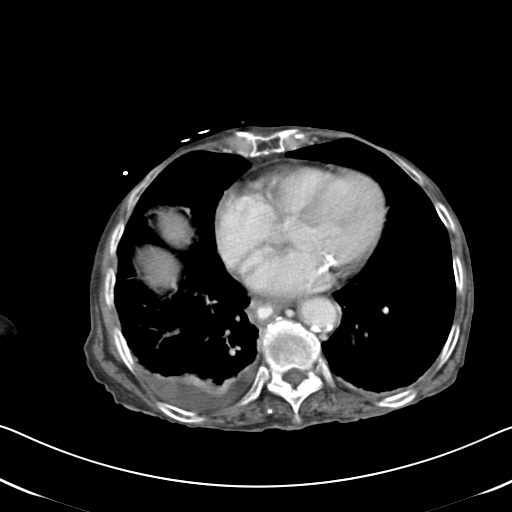

[Series 6: cor routine abd pel with · coronal · 0.65mm/px · 3 of 110 slices shown]
[im 37/110  soft-tissue]
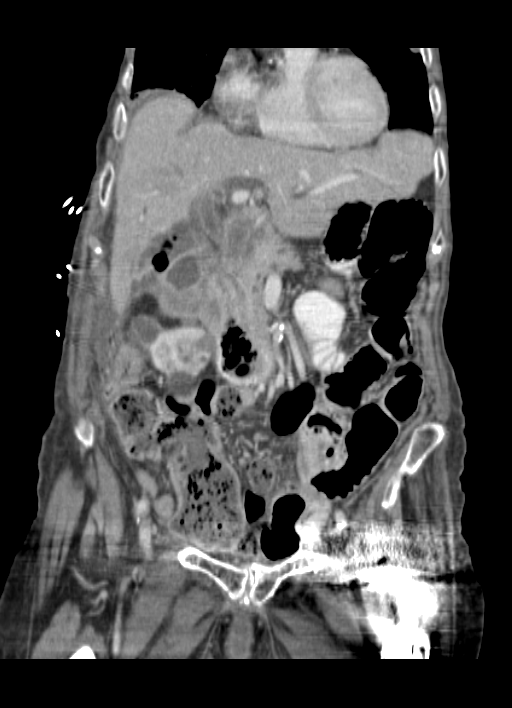
[im 49/110  soft-tissue]
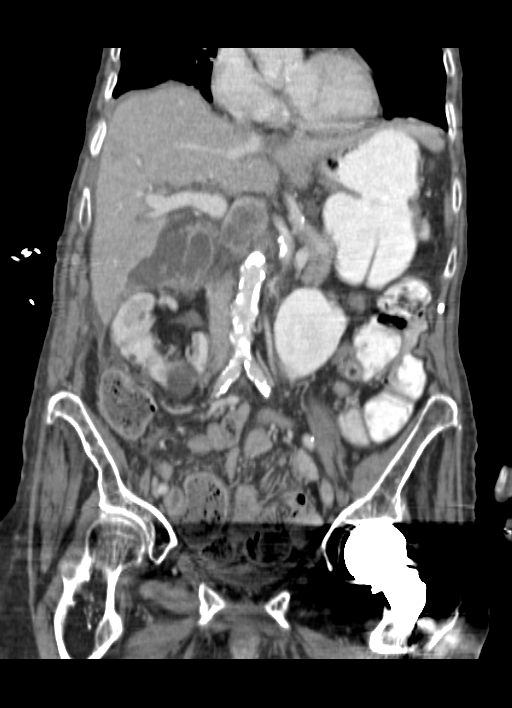
[im 61/110  soft-tissue]
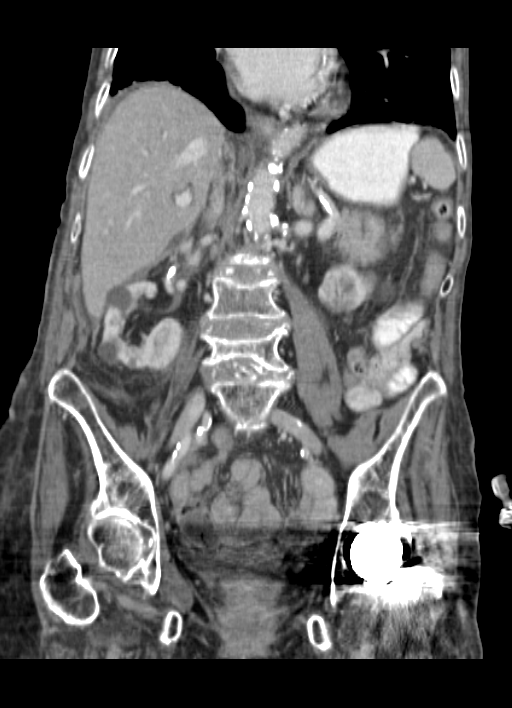

[15 of 46 positions shown; findings below may reference images not displayed]

FINDINGS: There are multiple low-attenuation liver lesions measuring up to
approximately 2 cm. These are new from 02/05/2012. There are
suspicious for hematogenous metastases.

There is cholecystectomy. There is moderate dilatation of the
extrahepatic and intrahepatic bile ducts. I believe there is
compression of the upper common bile duct as it passes around a
peripherally enhancing 3.8 cm porta hepatis mass. This is seen to
best advantage on coronal image 41, series 6. Compression of the
patent portal vein is also visible in this area, seen to best
advantage on axial images 22-26, series 2. Multiple peripherally
enhancing nodes are present in the porta hepatis, measuring from 2
to 4 cm in size. There is a small amount of perihepatic fluid.

There is mild dilatation of mid abdominal small bowel. This more
likely is nonobstructive. There is no evidence of bowel perforation.
Stool and air is present throughout the colon to the rectum.

There are unremarkable appearances of the pancreas, spleen, adrenals
and kidneys with the exception of numerous benign appearing renal
cysts.

In the lower chest, there are several sub cm pulmonary nodules.
There is a lingular mass measuring at least 1.6 cm, incompletely
imaged. There is a small right pleural effusion.

Multiple lower thoracic and lumbar vertebral compressions are
present, more likely benign but not conclusively characterized. The
L2 compression may be recent. No discrete bone lesions are evident.
IMPRESSION: 1. Multiple liver masses, likely hematogenous metastases.
2. Malignant-appearing upper abdominal adenopathy, primarily in the
porta hepatis.
3. Intrahepatic bile duct dilatation, with apparent compression of
the upper CBD as it passes around a porta hepatis node. There also
is compression of the portal vein between two porta hepatis nodes in
the same area.
4. Multiple pulmonary nodules. Question lingular mass, incompletely
imaged. Small right pleural effusion. Recommend chest radiographs.
5. Multiple lower thoracic and lumbar vertebral compressions of
indeterminate age.
These results were called by telephone at the time of interpretation
on 11/28/2014 at [DATE] to Dr. QUIRIJN AMAZIGH , who verbally
acknowledged these results.
